# Patient Record
Sex: Female | Born: 1950
Health system: Southern US, Community
[De-identification: ages and names within clinical notes are randomized; demographics above are authoritative.]

## PROBLEM LIST (undated history)

## (undated) DIAGNOSIS — E079 Disorder of thyroid, unspecified: Secondary | ICD-10-CM

## (undated) DIAGNOSIS — I4891 Unspecified atrial fibrillation: Secondary | ICD-10-CM

## (undated) DIAGNOSIS — R112 Nausea with vomiting, unspecified: Secondary | ICD-10-CM

## (undated) DIAGNOSIS — T8859XA Other complications of anesthesia, initial encounter: Secondary | ICD-10-CM

## (undated) DIAGNOSIS — F419 Anxiety disorder, unspecified: Secondary | ICD-10-CM

## (undated) DIAGNOSIS — E039 Hypothyroidism, unspecified: Secondary | ICD-10-CM

## (undated) DIAGNOSIS — I10 Essential (primary) hypertension: Secondary | ICD-10-CM

## (undated) DIAGNOSIS — Z9889 Other specified postprocedural states: Secondary | ICD-10-CM

## (undated) HISTORY — PX: ABDOMINAL HYSTERECTOMY: SHX81

---

## 2003-07-21 ENCOUNTER — Other Ambulatory Visit: Admission: RE | Admit: 2003-07-21 | Discharge: 2003-07-21 | Payer: Self-pay | Admitting: Family Medicine

## 2004-08-21 ENCOUNTER — Other Ambulatory Visit: Admission: RE | Admit: 2004-08-21 | Discharge: 2004-08-21 | Payer: Self-pay | Admitting: Family Medicine

## 2004-10-03 ENCOUNTER — Ambulatory Visit (HOSPITAL_COMMUNITY): Admission: RE | Admit: 2004-10-03 | Discharge: 2004-10-03 | Payer: Self-pay | Admitting: Gastroenterology

## 2005-08-06 ENCOUNTER — Other Ambulatory Visit: Admission: RE | Admit: 2005-08-06 | Discharge: 2005-08-06 | Payer: Self-pay | Admitting: Family Medicine

## 2005-09-16 ENCOUNTER — Emergency Department (HOSPITAL_COMMUNITY): Admission: EM | Admit: 2005-09-16 | Discharge: 2005-09-16 | Payer: Self-pay | Admitting: Emergency Medicine

## 2005-09-30 ENCOUNTER — Ambulatory Visit (HOSPITAL_BASED_OUTPATIENT_CLINIC_OR_DEPARTMENT_OTHER): Admission: RE | Admit: 2005-09-30 | Discharge: 2005-09-30 | Payer: Self-pay | Admitting: General Surgery

## 2005-09-30 ENCOUNTER — Encounter (INDEPENDENT_AMBULATORY_CARE_PROVIDER_SITE_OTHER): Payer: Self-pay | Admitting: Specialist

## 2006-11-11 ENCOUNTER — Other Ambulatory Visit: Admission: RE | Admit: 2006-11-11 | Discharge: 2006-11-11 | Payer: Self-pay | Admitting: Family Medicine

## 2007-11-18 ENCOUNTER — Other Ambulatory Visit: Admission: RE | Admit: 2007-11-18 | Discharge: 2007-11-18 | Payer: Self-pay | Admitting: Family Medicine

## 2008-11-25 ENCOUNTER — Other Ambulatory Visit: Admission: RE | Admit: 2008-11-25 | Discharge: 2008-11-25 | Payer: Self-pay | Admitting: Family Medicine

## 2010-08-09 ENCOUNTER — Encounter (HOSPITAL_COMMUNITY)
Admission: RE | Admit: 2010-08-09 | Discharge: 2010-08-09 | Disposition: A | Discharge status: Home / Self Care | Payer: Managed Care, Other (non HMO) | Source: Ambulatory Visit | Attending: Obstetrics and Gynecology | Admitting: Obstetrics and Gynecology

## 2010-08-09 DIAGNOSIS — Z01812 Encounter for preprocedural laboratory examination: Secondary | ICD-10-CM | POA: Insufficient documentation

## 2010-08-09 DIAGNOSIS — Z01810 Encounter for preprocedural cardiovascular examination: Secondary | ICD-10-CM | POA: Insufficient documentation

## 2010-08-09 LAB — BASIC METABOLIC PANEL
BUN: 9 mg/dL (ref 6–23)
CO2: 28 mEq/L (ref 19–32)
Calcium: 9 mg/dL (ref 8.4–10.5)
Chloride: 101 mEq/L (ref 96–112)
Creatinine, Ser: 0.53 mg/dL (ref 0.4–1.2)
GFR calc Af Amer: >60 mL/min (ref >60)
GFR calc non Af Amer: >60 mL/min (ref >60)
Glucose, Bld: 114 mg/dL — ABNORMAL HIGH (ref 70–99)
Potassium: 3.5 mEq/L (ref 3.5–5.1)
Sodium: 135 mEq/L (ref 135–145)

## 2010-08-09 LAB — CBC
HCT: 42.7 % (ref 36.0–46.0)
Hemoglobin: 14.1 g/dL (ref 12.0–15.0)
MCH: 29.6 pg (ref 26.0–34.0)
MCHC: 33 g/dL (ref 30.0–36.0)
MCV: 89.5 fL (ref 78.0–100.0)
Platelets: 198 10*3/uL (ref 150–400)
RBC: 4.77 MIL/uL (ref 3.87–5.11)
RDW: 13.8 % (ref 11.5–15.5)
WBC: 5.6 10*3/uL (ref 4.0–10.5)

## 2010-08-09 LAB — SURGICAL PCR SCREEN
MRSA, PCR: NEGATIVE
Staphylococcus aureus: NEGATIVE

## 2010-09-06 ENCOUNTER — Other Ambulatory Visit: Payer: Self-pay | Admitting: Obstetrics and Gynecology

## 2010-09-06 ENCOUNTER — Ambulatory Visit (HOSPITAL_COMMUNITY)
Admission: RE | Admit: 2010-09-06 | Discharge: 2010-09-07 | Disposition: A | Discharge status: Home / Self Care | Payer: Managed Care, Other (non HMO) | Source: Ambulatory Visit | Attending: Obstetrics and Gynecology | Admitting: Obstetrics and Gynecology

## 2010-09-06 DIAGNOSIS — Z01818 Encounter for other preprocedural examination: Secondary | ICD-10-CM | POA: Insufficient documentation

## 2010-09-06 DIAGNOSIS — Z01812 Encounter for preprocedural laboratory examination: Secondary | ICD-10-CM | POA: Insufficient documentation

## 2010-09-06 DIAGNOSIS — N812 Incomplete uterovaginal prolapse: Secondary | ICD-10-CM | POA: Insufficient documentation

## 2010-09-07 LAB — CBC
HCT: 34.2 % — ABNORMAL LOW (ref 36.0–46.0)
Hemoglobin: 11.2 g/dL — ABNORMAL LOW (ref 12.0–15.0)
MCH: 29.2 pg (ref 26.0–34.0)
MCHC: 32.7 g/dL (ref 30.0–36.0)
MCV: 89.1 fL (ref 78.0–100.0)
Platelets: 169 10*3/uL (ref 150–400)
RBC: 3.84 MIL/uL — ABNORMAL LOW (ref 3.87–5.11)
RDW: 13.9 % (ref 11.5–15.5)
WBC: 8.6 10*3/uL (ref 4.0–10.5)

## 2010-09-12 NOTE — Op Note (Signed)
Deanna Hamilton, Deanna Hamilton              ACCOUNT NO.:  [id number]  MEDICAL RECORD NO.:  [id number]           PATIENT TYPE:  LOCATION:                                 FACILITY:  PHYSICIAN:  Patsy Baltimore, MD     DATE OF BIRTH:  [birth year]  DATE OF PROCEDURE: DATE OF DISCHARGE:                              OPERATIVE REPORT   PREOPERATIVE DIAGNOSIS:  Uterine prolapse and cystocele.  POSTOPERATIVE DIAGNOSIS:  Uterine prolapse.  PROCEDURE PERFORMED:  Total vaginal hysterectomy with bilateral salpingo- oophorectomy.  SURGEON:  Patsy Baltimore, MD.  ASSISTANT:  Gerald Leitz, MD.  ANESTHESIA:  Regional spinal with IV sedation.  FINDINGS:  Grade 2 to 3 uterine prolapse.  SPECIMEN:  Specimen sent was uterus, bilateral tubes, and ovaries. Specimen was sent to pathology.  ESTIMATED BLOOD LOSS:  Minimal.  Ms. Deanna Hamilton is a 60 year old, para 2, seen in the office with postmenopausal bleeding.  She was found to have a uterine prolapse with an ulcerated cervical edge that caused her bleeding.  She had an on ultrasound, which measured her uterus at 6.9 x 3.5 x 4 cm with an endometrium measuring 2 mm.  Both ovaries appeared normal. On physical exam, the prolapse was seen approaching the introitus and she appeared to have an accompanying possible cystocele.  Therefore, informed consent was obtained from the patient for surgical repair.  She rejected the alternatives of doing nothing or pessary trial.  She was informed of the risks of pain, bleeding, infection, damage to surrounding structures, particularly her bladder, blood vessels, or bowel.  She presented on the day of surgery.  She has allergies to Keflex, doxycycline, and azithromycin.  Therefore, she was given Cleocin and Cipro for antibiotic prophylaxis.  She had TEDs and SCDs on for DVT prophylaxis.  She reiterated her consent and she was taken to the operating room. Decision was made by the patient and the anesthesia team for her  to have regional anesthesia as opposed to general anesthesia.  She did not want to be put asleep.  The spinal anesthesia was done with ease and her legs were then lifted up to the dorsal lithotomy position.  She was prepped and draped in usual sterile fashion. A timeout was called and we began the procedure. A foley catheter was inserted for continuous bladder drainage. A weighted vaginal speculum was placed posteriorly  in the vagina and a long deaver retractor was used to elevate the bladder anteriorly. Two double-toothed tenaculum were used to grasp the anterior and posterior cervical lips, respectively. Approximately 20cc of dilute vasopressin was injected  circumferentially beneath the mucosa. The margin of the bladder reflection was identified. The vaginal wall above the cervix was then circumcised. The anterior vaginal wall was then gasped and elevated with an Allis clamp. With additional tension placed  on the cervical lip below, I then used a combination of my gauze covered finger and the metzenbaum scissors to reach the vesicouterine fold. The was entered sharply with the Mayo scissors. Entry into the peritoneal cavity was confirmed by visualization  of the bowel fat. The anterior deaver was then repositioned to elevate the bladder  anteriorly within the peritoneal cavity. Posteriorly, vaginal vault was incised with a mayo scissors. Once the [facility] cul-de-sac entry was confirmed, a longer bladed  weighted speculum was placed through the opening. The uterosacral ligaments on both sides were identified and clamped off with the curved Heaney clamps. They were transected and ligated with 0-vicryl sutures. The sutures were clamped off with hemostats.  The cardinal ligaments were similarly clamped, cut and sutured. Further pedicles containing the uterine arteries and broad ligament were taken similarly. The uterine artery was doubly ligated to ensure hemostasis. The utero-ovarian ligament and round    ligaments were clamped off with curved heaneys and suture ligated. Once cut, hemostasis was noted on all the pedicles. The specimen was delivered. The ovaries were then identified on both sides and grasped withthe babcock clamp.  The infundibulopelvic ligaments were clamped and cut, and the ovaries were removed. All the pedicles were inspected.  There was some irrigation done with about 30 mL of lukewarm saline.  We had excellent hemostasis.  A pursestring suture was done with 0 Vicryl around the peritoneum to close the peritoneum.  The angle stitches were then done of the cuff.  The old uterosacral sutures were then tied in the midline.  The vaginal cuff was closed in a running locked fashion.  At this point, inspection of the vagina revealed that there was really no cystocele.  The distance from her urethral meatus to the leading edge of the closed vaginal cuff was about 4-5 cm and really there was no room to perform an anterior repair as there was no definite cystocele.  There was a little bit of the rectocele, but it was mild, approximately grade 1.  At this point, a decision was made not to perform an anterior repair because it was not indicated.  All the instruments were removed from the vagina.  The patient tolerated the procedure well.  A Foley was noted to drain clear yellow urine from the beginning to the end of the procedure.  The sponge, lap, and instrument counts were correct x2.  The patient tolerated the procedure well.          ______________________________ Patsy Baltimore, MD     CO/MEDQ  D:  09/06/2010  T:  09/06/2010  Job:  [id number]  Electronically Signed by Patsy Baltimore MD on 09/12/2010 01:28:43 PM

## 2010-10-12 NOTE — Op Note (Signed)
Deanna Hamilton, Deanna Hamilton              ACCOUNT NO.:  [id number]   MEDICAL RECORD NO.:  [id number]          PATIENT TYPE:  AMB   LOCATION:  [facility]                         FACILITY:  [facility]   PHYSICIAN:  Timothy E. Earlene Plater, M.D. DATE OF BIRTH:  [birth year]   DATE OF PROCEDURE:  09/30/2005  DATE OF DISCHARGE:                                 OPERATIVE REPORT   PREOPERATIVE DIAGNOSIS:  Left inguinal hernia.   POSTOPERATIVE DIAGNOSIS:  Left direct and indirect inguinal hernia.   OPERATION PERFORMED:  Repair of hernia with mesh.   SURGEON:  Timothy E. Earlene Plater, M.D.   ANESTHESIA:  General.   INDICATIONS FOR PROCEDURE:  [name] is a 61, otherwise healthy, had  recent cardiac work-up for irregular rhythm, was negative.  She has been  treated.  She has a symptomatic left inguinal hernia and after careful  discussion, she wishes to proceed with repair.  She was seen and identified,  left groin marked and the permit signed.   DESCRIPTION OF PROCEDURE:  The patient was taken to the operating room,  placed supine, LMA anesthesia provided.  The left groin was prepped and  draped in the usual sterile fashion.  The defect was palpable through the  skin and this area was anesthetized with 0.25% Marcaine with epinephrine.  A  short curvilinear incision was made in the skin crease over the palpable  defect and the subcutaneous tissue dissected and the external oblique  identified and dissected free and opened in line with its fibers through the  external ring.  The remnants of the round ligament were dissected.  A lipoma  with an indirect sac was also seen exiting from the internal ring.  Care was  taken to bluntly dissect the ilioinguinal nerve laterally off the area of  dissection.  The sac was suture ligated at its neck and reduced through the  internal ring.  Likewise a lipoma was excised and ligated with a suture.  The internal ring was obliterated with a pursestring suture of 2-0 Prolene.  A small  piece of mesh was cut and fashioned to fit the entire floor of the  canal and was sewn down with a running 2-0 Prolene.  The nerve was unharmed  and the procedure was now essentially complete.  The external oblique was  closed with running 2-0 Vicryl, the deep subcu was  approximated and the skin was closed with 3-0 Monocryl.  Steri-Strips  applied.  Counts correct.  She tolerated it well, was awakened and taken to  recovery room in good condition.   Written and verbal instructions including Vicodin #36.  She will be followed  in the office.      Timothy E. Earlene Plater, M.D.  Electronically Signed     TED/MEDQ  D:  09/30/2005  T:  10/01/2005  Job:  [id number]   cc:   Otilio Connors. Gerri Spore, M.D.  Fax: [phone]

## 2010-10-12 NOTE — Op Note (Signed)
[name], [name]              ACCOUNT NO.:  [id number]   MEDICAL RECORD NO.:  [id number]          PATIENT TYPE:  AMB   LOCATION:  ENDO                         FACILITY:  [facility]   PHYSICIAN:  John C. Madilyn Fireman, M.D.    DATE OF BIRTH:  [birth year]   DATE OF PROCEDURE:  10/03/2004  DATE OF DISCHARGE:                                 OPERATIVE REPORT   PROCEDURE:  Colonoscopy.   INDICATION FOR PROCEDURE:  Family history of colon cancer in a first degree  relative.   DESCRIPTION OF PROCEDURE:  The patient was placed in the left lateral  decubitus position and placed on pulse monitor with continuous low-flow  oxygen delivered by nasal cannula.  She was sedated with 100 mcg of IV  fentanyl and 10 mg of IV Versed.  The Olympus video colonoscope was inserted  into the rectum and advanced to the cecum, confirmed by transillumination of  McBurney's point and visualization of the ileocecal valve and appendiceal  orifice.  The prep was excellent.  The cecum, ascending, transverse,  descending, sigmoid and rectum all appeared normal with no masses, polyps,  diverticula or other mucosa abnormalities.  Retroflexed view of the anus did  reveal some slightly enlarged internal hemorrhoids.  The scope was then  withdrawn and the patient returned to the recovery room in stable condition.  She tolerated the procedure well and there were no immediate complications.   IMPRESSION:  Internal hemorrhoids, otherwise normal study.   PLAN:  Based on her family history, will repeat colonoscopy in five years.      JCH/MEDQ  D:  10/03/2004  T:  10/03/2004  Job:  [id number]   cc:   Otilio Connors. Gerri Spore, M.D.  [address]  [place]  [place] [address]  Fax: [phone]

## 2017-02-22 ENCOUNTER — Emergency Department (HOSPITAL_COMMUNITY)
Admission: EM | Admit: 2017-02-22 | Discharge: 2017-02-22 | Disposition: A | Discharge status: Home / Self Care | Payer: Medicare Other | Attending: Emergency Medicine | Admitting: Emergency Medicine

## 2017-02-22 ENCOUNTER — Emergency Department (HOSPITAL_COMMUNITY): Payer: Medicare Other

## 2017-02-22 ENCOUNTER — Encounter (HOSPITAL_COMMUNITY): Payer: Self-pay | Admitting: Emergency Medicine

## 2017-02-22 DIAGNOSIS — F419 Anxiety disorder, unspecified: Secondary | ICD-10-CM | POA: Insufficient documentation

## 2017-02-22 DIAGNOSIS — F17200 Nicotine dependence, unspecified, uncomplicated: Secondary | ICD-10-CM | POA: Insufficient documentation

## 2017-02-22 DIAGNOSIS — I482 Chronic atrial fibrillation: Secondary | ICD-10-CM | POA: Insufficient documentation

## 2017-02-22 DIAGNOSIS — I4821 Permanent atrial fibrillation: Secondary | ICD-10-CM

## 2017-02-22 DIAGNOSIS — R002 Palpitations: Secondary | ICD-10-CM

## 2017-02-22 HISTORY — DX: Disorder of thyroid, unspecified: E07.9

## 2017-02-22 HISTORY — DX: Anxiety disorder, unspecified: F41.9

## 2017-02-22 LAB — CBC
HCT: 40.4 % (ref 36.0–46.0)
Hemoglobin: 13.7 g/dL (ref 12.0–15.0)
MCH: 29.8 pg (ref 26.0–34.0)
MCHC: 33.9 g/dL (ref 30.0–36.0)
MCV: 87.8 fL (ref 78.0–100.0)
Platelets: 232 10*3/uL (ref 150–400)
RBC: 4.6 MIL/uL (ref 3.87–5.11)
RDW: 13.9 % (ref 11.5–15.5)
WBC: 4.5 10*3/uL (ref 4.0–10.5)

## 2017-02-22 LAB — BASIC METABOLIC PANEL
Anion gap: 9 (ref 5–15)
BUN: 7 mg/dL (ref 6–20)
CO2: 25 mmol/L (ref 22–32)
Calcium: 9 mg/dL (ref 8.9–10.3)
Chloride: 98 mmol/L — ABNORMAL LOW (ref 101–111)
Creatinine, Ser: 0.61 mg/dL (ref 0.44–1.00)
GFR calc Af Amer: >60 mL/min (ref >60)
GFR calc non Af Amer: >60 mL/min (ref >60)
Glucose, Bld: 105 mg/dL — ABNORMAL HIGH (ref 65–99)
Potassium: 3.7 mmol/L (ref 3.5–5.1)
Sodium: 132 mmol/L — ABNORMAL LOW (ref 135–145)

## 2017-02-22 LAB — TSH: TSH: 4.992 u[IU]/mL — ABNORMAL HIGH (ref 0.350–4.500)

## 2017-02-22 LAB — I-STAT TROPONIN, ED: Troponin i, poc: 0.01 ng/mL (ref 0.00–0.08)

## 2017-02-22 MED ORDER — APIXABAN 2.5 MG PO TABS: 2.5000 mg | ORAL_TABLET | Freq: Two times a day (BID) | ORAL | 0 refills | Status: DC

## 2017-02-22 MED ORDER — IOPAMIDOL (ISOVUE-370) INJECTION 76%
INTRAVENOUS | Status: AC
  Administered 2017-02-22: 100 mL via INTRAVENOUS
  Filled 2017-02-22: qty 100

## 2017-02-22 MED ORDER — METOPROLOL TARTRATE 50 MG PO TABS: 50.0000 mg | ORAL_TABLET | Freq: Two times a day (BID) | ORAL | 0 refills | Status: DC

## 2017-02-22 NOTE — ED Provider Notes (Signed)
Emergency Department Provider Note   I have reviewed the triage vital signs and the nursing notes.   HISTORY  Chief Complaint Palpitations (heart racing); Anxiety; and Shortness of Breath   HPI Deanna Hamilton is a 66 y.o. female with history of atrial fibrillation that has been controlled with metoprolol and ASA who presents to ER with episodic palpitations for last few weeks after recently starting levothyroxine. Last time it happened was last night and could not sleep much afterwards and became anxiety. Did not seem to improve with xanax. Episodes more frequent recently.    Past Medical History:  Diagnosis Date  . Anxiety   . Thyroid disease     There are no active problems to display for this patient.   History reviewed. No pertinent surgical history.  Current Outpatient Rx  . Order #: [id number] Class: Historical Med  . Order #: [id number] Class: Historical Med  . Order #: [id number] Class: Historical Med  . Order #: [id number] Class: Historical Med  . Order #: [id number] Class: Historical Med  . Order #: [id number] Class: Print  . Order #: [id number] Class: Print    Allergies Erythromycin; Prednisone; and Xanax [alprazolam]  No family history on file.  Social History Social History  Substance Use Topics  . Smoking status: Current Every Day Smoker  . Smokeless tobacco: Current User  . Alcohol use No    Review of Systems  All other systems negative except as documented in the HPI. All pertinent positives and negatives as reviewed in the HPI. ____________________________________________   PHYSICAL EXAM:  VITAL SIGNS: ED Triage Vitals  Enc Vitals Group     BP 02/22/17 0952 132/86     Pulse Rate 02/22/17 0952 (!) 106     Resp 02/22/17 0952 17     Temp 02/22/17 0952 98.5 F (36.9 C)     Temp Source 02/22/17 0952 Oral     SpO2 02/22/17 0952 97 %     Weight 02/22/17 0958 118 lb (53.5 kg)     Height 02/22/17 0958  (1.651 m)     Pain Score 02/22/17 0957 0     Constitutional: Alert and oriented. Well appearing and in no acute distress. Eyes: Conjunctivae are normal. PERRL. EOMI. Head: Atraumatic. Nose: No congestion/rhinnorhea. Mouth/Throat: Mucous membranes are moist.  Oropharynx non-erythematous. Neck: No stridor.  No meningeal signs.   Cardiovascular: Normal rate, regular rhythm. Good peripheral circulation. Grossly normal heart sounds.  While speaking to her, had an episode of RVR 2/2 Afib that resolved after approx 30 seconds.  Respiratory: Normal respiratory effort.  No retractions. Lungs CTAB. Gastrointestinal: Soft and nontender. No distention.  Musculoskeletal: No lower extremity tenderness nor edema. No gross deformities of extremities. Neurologic:  Normal speech and language. No gross focal neurologic deficits are appreciated.  Skin:  Skin is warm, dry and intact. No rash noted.   ____________________________________________   LABS (all labs ordered are listed, but only abnormal results are displayed)  Labs Reviewed  BASIC METABOLIC PANEL - Abnormal; Notable for the following:       Result Value   Sodium 132 (*)    Chloride 98 (*)    Glucose, Bld 105 (*)    All other components within normal limits  TSH - Abnormal; Notable for the following:    TSH 4.992 (*)    All other components within normal limits  CBC  T4, FREE  T3  I-STAT TROPONIN, ED   ____________________________________________  EKG   EKG Interpretation  Date/Time:  Saturday February 22 2017 10:00:03 EDT Ventricular Rate:  121 PR Interval:  160 QRS Duration: 78 QT Interval:  316 QTC Calculation: 448 R Axis:   86 Text Interpretation:  Sinus tachycardia with Premature atrial complexes Nonspecific ST abnormality Abnormal ECG No significant change since last tracing Confirmed by Marily Memos [phone]) on 02/22/2017 4:40:40 PM      ____________________________________________  RADIOLOGY  Dg Chest 2 View  Result Date: 02/22/2017 CLINICAL DATA:   66 year old female with a history of irregular heartbeat EXAM: CHEST  2 VIEW COMPARISON:  None. FINDINGS: Cardiomediastinal silhouette within normal limits. Stigmata of emphysema, with increased retrosternal airspace, flattened hemidiaphragms, increased AP diameter, and hyperinflation on the AP view. Coarsened interstitial markings bilaterally with pleuroparenchymal scarring at the apices of the lungs. Nodular opacity at the right lung apex just inferior to the clavicle. Symmetric nipple shadows at the lung bases. No confluent airspace disease. No pneumothorax. No pleural effusion. No displaced fracture. IMPRESSION: Changes of advanced emphysema with likely chronic lung changes. No evidence of lobar pneumonia. Nodular density at the right apex, in a region of pleuroparenchymal scarring. Lung nodule not excluded. Note that low-dose CT lung cancer screening is recommended for patients who are 2-71 years of age with a 30+ pack-year history of smoking, and who are currently smoking or quit <=15 years ago. Referral for pulmonary evaluation recommended, for risk factor evaluation and chest CT. Electronically Signed   By: Gilmer Mor D.O.   On: 02/22/2017 11:04   Ct Angio Chest Pe W And/or Wo Contrast  Result Date: 02/22/2017 CLINICAL DATA:  66 year old female with a history of rapid heart rate EXAM: CT ANGIOGRAPHY CHEST WITH CONTRAST TECHNIQUE: Multidetector CT imaging of the chest was performed using the standard protocol during bolus administration of intravenous contrast. Multiplanar CT image reconstructions and MIPs were obtained to evaluate the vascular anatomy. CONTRAST:  100 cc Isovue 300 COMPARISON:  None. FINDINGS: Cardiovascular: Heart: No cardiomegaly. No pericardial fluid/thickening. Calcifications of the left anterior descending and right coronary artery. Aorta: Unremarkable course caliber and contour of the thoracic aorta. Atherosclerotic changes present. Pulmonary arteries: No central, lobar,  segmental, or proximal subsegmental filling defects. Mediastinum/Nodes: No mediastinal adenopathy. Unremarkable appearance of the thoracic inlet. Lungs/Pleura: Extensive paraseptal and centrilobular emphysema of the bilateral lungs. Pleuroparenchymal scarring/thickening of the apices of the lungs. No focal nodule at the right apex as a correlate to the prior chest x-ray. No confluent airspace disease. No pleural effusion or pneumothorax. Mild debris within the trachea at the carina extending into the right lower lobe bronchus. Upper Abdomen: Unremarkable. Musculoskeletal: No displaced fracture. Degenerative changes of the spine. Review of the MIP images confirms the above findings. IMPRESSION: CT is negative for pulmonary emboli. The correlate to findings on prior chest x-ray are related to right apical scarring/ pleuroparenchymal thickening, with no focal lung nodule identified. There are symmetric chronic changes on the left. Bilateral paraseptal and centrilobular emphysema. Emphysema (ICD10-J43.9). Minimal endotracheal debris/ mucous at the carina extending into the right lower lobe. Two vessel coronary artery disease. Electronically Signed   By: Gilmer Mor D.O.   On: 02/22/2017 14:41    ____________________________________________   PROCEDURES  Procedure(s) performed:   Procedures   ____________________________________________   INITIAL IMPRESSION / ASSESSMENT AND PLAN / ED COURSE  Pertinent labs & imaging results that were available during my care of the patient were reviewed by me and considered in my medical decision making (see chart for details).  I doubt that her symptoms are from anxiety. I think that  the patient is likely having paroxysmal Afib RVR which causes palpitations, causing anxiety and  Thus her symptoms. Discussed with Dr. Donnie Aho and will increase her metoprolol and start eliquis. Patient not wanting to start eliquis until Dr. Anne Fu approves, will send message in Epic  to him. Stable for dc home at this time.   ____________________________________________  FINAL CLINICAL IMPRESSION(S) / ED DIAGNOSES  Final diagnoses:  Permanent atrial fibrillation (HCC)  Palpitations     MEDICATIONS GIVEN DURING THIS VISIT:  Medications  iopamidol (ISOVUE-370) 76 % injection (100 mLs Intravenous Contrast Given 02/22/17 1414)     NEW OUTPATIENT MEDICATIONS STARTED DURING THIS VISIT:  Discharge Medication List as of 02/22/2017  3:52 PM    START taking these medications   Details  apixaban (ELIQUIS) 2.5 MG TABS tablet Take 1 tablet (2.5 mg total) by mouth 2 (two) times daily., Starting Sat 02/22/2017, Print    metoprolol tartrate (LOPRESSOR) 50 MG tablet Take 1 tablet (50 mg total) by mouth 2 (two) times daily., Starting Sat 02/22/2017, Print        Note:  This document was prepared using Dragon voice recognition software and may include unintentional dictation errors.   Marily Memos, MD 02/22/17 [phone]

## 2017-02-22 NOTE — Discharge Instructions (Signed)
Cardiologist recommends you start Eliquis for her atrial fibrillation before you see your cardiologist. They also recommended you to increase your metoprolol from 25 daily to 50 twice daily, however I would transition from 25 daily the 25 twice a day for a few days to make sure that you can handle it and then start 50 twice a day. I know you are hesitant to start the Eliquis, if you don't want to start it you need to at least take 324 aspirin.

## 2017-02-22 NOTE — ED Triage Notes (Signed)
Pt. Stated, I woke up around 230 with my heart racing, and I have anxiety, my hand were cold.  I did calm down.

## 2017-02-24 ENCOUNTER — Encounter: Payer: Self-pay | Admitting: Cardiology

## 2017-02-24 ENCOUNTER — Ambulatory Visit (INDEPENDENT_AMBULATORY_CARE_PROVIDER_SITE_OTHER): Payer: Medicare Other | Admitting: Cardiology

## 2017-02-24 VITALS — BP 126/82 | HR 68 | Ht 65.0 in | Wt 117.2 lb

## 2017-02-24 DIAGNOSIS — E039 Hypothyroidism, unspecified: Secondary | ICD-10-CM

## 2017-02-24 DIAGNOSIS — I2510 Atherosclerotic heart disease of native coronary artery without angina pectoris: Secondary | ICD-10-CM

## 2017-02-24 DIAGNOSIS — J438 Other emphysema: Secondary | ICD-10-CM | POA: Diagnosis not present

## 2017-02-24 DIAGNOSIS — I2584 Coronary atherosclerosis due to calcified coronary lesion: Secondary | ICD-10-CM

## 2017-02-24 DIAGNOSIS — Z720 Tobacco use: Secondary | ICD-10-CM

## 2017-02-24 DIAGNOSIS — R002 Palpitations: Secondary | ICD-10-CM

## 2017-02-24 MED ORDER — METOPROLOL TARTRATE 25 MG PO TABS: 25.0000 mg | ORAL_TABLET | Freq: Two times a day (BID) | ORAL | 3 refills | Status: DC

## 2017-02-24 NOTE — Progress Notes (Signed)
Cardiology Office Note:    Date:  02/24/2017   ID:  Deanna Hamilton, DOB [birth year], MRN [id number]  PCP:  Shirlean Mylar, MD  Cardiologist:  Donato Schultz, MD    Referring MD: Shirlean Mylar, MD     History of Present Illness:    Deanna Hamilton is a 66 y.o. female with Palpitations, anxiety, shortness of breath here for evaluation of atrial fibrillation at the request of Dr. Shirlean Mylar  She was in the emergency room on 02/22/17 with episodic palpitations lasting a few weeks after starting Synthroid. She could not sleep and became anxious. Xanax did not seem to help. These episodes seem to become more frequent.  Sinus tachycardia with PACs, nonspecific ST changes were noted in the ER. Lung nodules noted on CT scan with advanced changes of emphysema. CT scan was negative for PE and negative for lung nodule.  The thought was that the patient was having paroxysmal A. fib with RVR causing palpitations. This was discussed also with Dr. Donnie Aho on call who suggested increasing her metoprolol starting Eliquis. She did not want to start it at this time.  Deanna Hamilton has passed. I called after death.  Dr. Sharl Ma to see in 2022/07/18.   She denies any anginal symptoms.  Past Medical History:  Diagnosis Date  . Anxiety   . Thyroid disease     No past surgical history on file.  Current Medications: Current Meds  Medication Sig  . aspirin EC 81 MG tablet Take 81 mg by mouth daily.  . Famotidine (PEPCID PO) Take 1 tablet by mouth as needed (GERD).  Marland Kitchen ibuprofen (ADVIL,MOTRIN) 200 MG tablet Take 200 mg by mouth every 6 (six) hours as needed for mild pain.  Marland Kitchen levothyroxine (SYNTHROID, LEVOTHROID) 25 MCG tablet Take 25 mcg by mouth daily.  . metoprolol succinate (TOPROL-XL) 25 MG 24 hr tablet Take 25 mg by mouth daily.  . metoprolol tartrate (LOPRESSOR) 25 MG tablet Take 25 mg by mouth 2 (two) times daily.  . [DISCONTINUED] metoprolol tartrate (LOPRESSOR) 50 MG tablet Take 1 tablet (50 mg total) by mouth 2  (two) times daily.     Allergies:   Erythromycin; Prednisone; and Xanax [alprazolam]   Social History   Social History  . Marital status: Widowed    Spouse name: N/A  . Number of children: N/A  . Years of education: N/A   Social History Main Topics  . Smoking status: Current Every Day Smoker  . Smokeless tobacco: Current User  . Alcohol use No  . Drug use: No  . Sexual activity: Not Asked   Other Topics Concern  . None   Social History Narrative  . None     Family History: No early family history of CAD   ROS:   Please see the history of present illness.    Positive for palpitations, no syncope, no bruising, no orthopnea, no PND All other systems reviewed and are negative.  EKGs/Labs/Other Studies Reviewed:    The following studies were reviewed today: ER visit reviewed, lab work reviewed, EKG reviewed  EKG:  EKG from ER visit demonstrates sinus tachycardia with PACs. Personally reviewed  Recent Labs: 02/22/2017: BUN 7; Creatinine, Ser 0.61; Hemoglobin 13.7; Platelets 232; Potassium 3.7; Sodium 132; TSH 4.992  Recent Lipid Panel No results found for: CHOL, TRIG, HDL, CHOLHDL, VLDL, LDLCALC, LDLDIRECT  Physical Exam:    VS:  BP 126/82 (BP Location: Right Arm)   Pulse 68   Ht  (1.651 m)  Wt 117 lb 3.2 oz (53.2 kg)   LMP  (LMP Unknown)   BMI 19.50 kg/m     Wt Readings from Last 3 Encounters:  02/24/17 117 lb 3.2 oz (53.2 kg)  02/22/17 118 lb (53.5 kg)     GEN:  Thin in no acute distress HEENT: Normal NECK: No JVD; No carotid bruits LYMPHATICS: No lymphadenopathy CARDIAC: RRROccasional ectopy, no murmurs, rubs, gallops RESPIRATORY:  Clear to auscultation without rales, wheezing or rhonchi  ABDOMEN: Soft, non-tender, non-distended MUSCULOSKELETAL:  No edema; No deformity  SKIN: Warm and dry NEUROLOGIC:  Alert and oriented x 3 PSYCHIATRIC:  Normal affect   ASSESSMENT:    1. Palpitations   2. Acquired hypothyroidism   3. Coronary artery  calcification   4. Tobacco use   5. Other emphysema (HCC)    PLAN:    In order of problems listed above:  Palpations  - This could have been representative of atrial fibrillation with rapid ventricular response however we do not have any objective evidence of this. I will place a 30 day event monitor. Her EKG in the ER shows sinus tachycardia with PACs. Do not start Eliquis unless atrial fibrillation is confirmed. Okay with her taking metoprolol tartrate 25 mg twice a day.  Hypothyroidism  - She has an appointment in January with Dr. Sharl Ma. Continue.  - Low-dose Synthroid 25 g.  Coronary atherosclerosis/calcification  - Noted on CT scan.  Emphysematous changes  - Noted on CT scan. No evidence of apical nodule on CT.  - It may not be a bad idea for her to get spirometry I Dr. Hyman Hopes  Tobacco use  - Tobacco cessation discussed. She is going get Nicorette. She went through a very stressful time in the past 5 years with Deanna Hamilton her husband who passed away and was a patient of mine.   Medication Adjustments/Labs and Tests Ordered: Current medicines are reviewed at length with the patient today.  Concerns regarding medicines are outlined above.  Orders Placed This Encounter  Procedures  . Cardiac event monitor   No orders of the defined types were placed in this encounter.   Signed, Donato Schultz, MD  02/24/2017 11:06 AM     [facility]

## 2017-02-24 NOTE — Patient Instructions (Signed)
Medication Instructions:  The current medical regimen is effective;  continue present plan and medications.  Testing/Procedures: Your physician has recommended that you wear an event monitor. Event monitors are medical devices that record the heart's electrical activity. Doctors most often [place] these monitors to diagnose arrhythmias. Arrhythmias are problems with the speed or rhythm of the heartbeat. The monitor is a small, portable device. You can wear one while you do your normal daily activities. This is usually used to diagnose what is causing palpitations/syncope (passing out).  Follow-Up: Follow up in 6 months with Dr. Anne Fu.  You will receive a letter in the mail 2 months before you are due.  Please call us when you receive this letter to schedule your follow up appointment.  If you need a refill on your cardiac medications before your next appointment, please call your pharmacy.  Thank you for choosing [facility]!!

## 2017-03-10 ENCOUNTER — Ambulatory Visit (INDEPENDENT_AMBULATORY_CARE_PROVIDER_SITE_OTHER): Payer: Medicare Other

## 2017-03-10 DIAGNOSIS — R002 Palpitations: Secondary | ICD-10-CM | POA: Diagnosis not present

## 2017-08-14 ENCOUNTER — Encounter: Payer: Self-pay | Admitting: *Deleted

## 2017-08-25 ENCOUNTER — Encounter: Payer: Self-pay | Admitting: Cardiology

## 2017-08-25 ENCOUNTER — Encounter (INDEPENDENT_AMBULATORY_CARE_PROVIDER_SITE_OTHER): Payer: Self-pay

## 2017-08-25 ENCOUNTER — Ambulatory Visit: Payer: Medicare Other | Admitting: Cardiology

## 2017-08-25 VITALS — BP 110/68 | HR 60 | Ht 65.5 in | Wt 117.1 lb

## 2017-08-25 DIAGNOSIS — I2584 Coronary atherosclerosis due to calcified coronary lesion: Secondary | ICD-10-CM | POA: Diagnosis not present

## 2017-08-25 DIAGNOSIS — R002 Palpitations: Secondary | ICD-10-CM | POA: Diagnosis not present

## 2017-08-25 DIAGNOSIS — I2510 Atherosclerotic heart disease of native coronary artery without angina pectoris: Secondary | ICD-10-CM

## 2017-08-25 DIAGNOSIS — Z720 Tobacco use: Secondary | ICD-10-CM | POA: Diagnosis not present

## 2017-08-25 NOTE — Progress Notes (Signed)
Cardiology Office Note:    Date:  08/25/2017   ID:  Deanna Hamilton, DOB 01/18/1951, MRN [id number]  PCP:  Deanna MylarWebb, Carol, MD  Cardiologist:  Deanna SchultzMark Jerica Creegan, MD    Referring MD: Deanna MylarWebb, Carol, MD     History of Present Illness:    Deanna Hamilton is a [age] y.o. female with palpitations, anxiety, shortness of breath here for follow-up, Dr. Shirlean Mylararol Webb primary physician.  Event monitor did not show any evidence of atrial fibrillation.  She was in the emergency room on 02/22/17 with episodic palpitations lasting a few weeks after starting Synthroid. She could not sleep and became anxious. Xanax did not seem to help. These episodes seem to become more frequent.  Sinus tachycardia with PACs, nonspecific ST changes were noted in the ER. Lung nodules noted on CT scan with advanced changes of emphysema. CT scan was negative for PE and negative for lung nodule.  The thought was that the patient was having paroxysmal A. fib with RVR causing palpitations. This was discussed also with Dr. Donnie Ahoilley on call who suggested increasing her metoprolol starting Eliquis. She did not want to start it at that time.  Deanna CoombsDon has passed. I called after death.  Dr. Sharl Hamilton has seen in endocrinology.   08/25/17- overall doing well.  Rarely feels palpitations.  Feels much better than she did at last visit.  Her thyroid has been adjusted, Dr. Sharl Hamilton.  She does note some redness, scaling on her cheeks bilaterally.  I asked her to discuss this with Dr. Sharl Hamilton.  Could be thyroid side effect.  No bleeding, no syncope, no orthopnea, no PND.    Past Medical History:  Diagnosis Date  . Anxiety   . Thyroid disease     History reviewed. No pertinent surgical history.  Current Medications: Current Meds  Medication Sig  . aspirin EC 81 MG tablet Take 81 mg by mouth daily.  . Famotidine (PEPCID PO) Take 1 tablet by mouth as needed (GERD).  Marland Kitchen. ibuprofen (ADVIL,MOTRIN) 200 MG tablet Take 200 mg by mouth every 6 (six) hours as needed for mild  pain.  Marland Kitchen. levothyroxine (SYNTHROID, LEVOTHROID) 50 MCG tablet Take 1 tablet by mouth daily.  . metoprolol tartrate (LOPRESSOR) 25 MG tablet Take 1 tablet (25 mg total) by mouth 2 (two) times daily.     Allergies:   Erythromycin; Prednisone; and Xanax [alprazolam]   Social History   Socioeconomic History  . Marital status: Widowed    Spouse name: Not on file  . Number of children: Not on file  . Years of education: Not on file  . Highest education level: Not on file  Occupational History  . Not on file  Social Needs  . Financial resource strain: Not on file  . Food insecurity:    Worry: Not on file    Inability: Not on file  . Transportation needs:    Medical: Not on file    Non-medical: Not on file  Tobacco Use  . Smoking status: Current Every Day Smoker    Packs/day: 0.50    Types: Cigarettes  . Smokeless tobacco: Current User  Substance and Sexual Activity  . Alcohol use: No  . Drug use: No  . Sexual activity: Not on file  Lifestyle  . Physical activity:    Days per week: Not on file    Minutes per session: Not on file  . Stress: Not on file  Relationships  . Social connections:    Talks on phone: Not  on file    Gets together: Not on file    Attends religious service: Not on file    Active member of club or organization: Not on file    Attends meetings of clubs or organizations: Not on file    Relationship status: Not on file  Other Topics Concern  . Not on file  Social History Narrative  . Not on file     Family History: No early family history of CAD   ROS:   Please see the history of present illness.   All other review of systems negative  EKGs/Labs/Other Studies Reviewed:    The following studies were reviewed today: ER visit reviewed, lab work reviewed, EKG reviewed  EKG:  EKG from ER visit demonstrates sinus tachycardia with PACs. Personally reviewed  Recent Labs: 02/22/2017: BUN 7; Creatinine, Ser 0.61; Hemoglobin 13.7; Platelets 232;  Potassium 3.7; Sodium 132; TSH 4.992  Recent Lipid Panel No results found for: CHOL, TRIG, HDL, CHOLHDL, VLDL, LDLCALC, LDLDIRECT  Physical Exam:    VS:  BP 110/68   Pulse (!) 45   Ht 5' 5.5" (1.664 m)   Wt 117 lb 1.9 oz (53.1 kg)   LMP  (LMP Unknown)   SpO2 90%   BMI 19.19 kg/m     Wt Readings from Last 3 Encounters:  08/25/17 117 lb 1.9 oz (53.1 kg)  02/24/17 117 lb 3.2 oz (53.2 kg)  02/22/17 118 lb (53.5 kg)    GEN: Thin, in no acute distress  HEENT: normal  Neck: no JVD, carotid bruits, or masses Cardiac: RRR, rare ectopy; no murmurs, rubs, or gallops,no edema  Respiratory:  clear to auscultation bilaterally, normal work of breathing GI: soft, nontender, nondistended, + BS MS: no deformity or atrophy  Skin: warm and dry, mild redness, scaling bilateral cheeks Neuro:  Alert and Oriented x 3, Strength and sensation are intact Psych: euthymic mood, full affect   ASSESSMENT:    No diagnosis found. PLAN:    In order of problems listed above:  Palpations  - This could have been representative of atrial fibrillation with rapid ventricular response however we do not have any objective evidence of this. Event monitor 03/10/17:  Overall reassuring monitor. Rare PVCs noted. Rare PACs consistent with palpitations. No atrial fibrillation. Okay with her taking metoprolol tartrate 25 mg twice a day.  Hypothyroidism  - She has an appointment in January with Dr. Sharl Ma. Continue.  - Low-dose Synthroid 50 g.  Coronary atherosclerosis/calcification  - Noted on CT scan.  Secondary prevention.  Diet, exercise, low-dose aspirin.  LDL cholesterol 94.  May need to start statin.  Emphysematous changes  - Noted on CT scan. No evidence of apical nodule on CT.  Tobacco use  -Continue to encourage cessation. 0.5 PPD. Coronary calcium.    Medication Adjustments/Labs and Tests Ordered: Current medicines are reviewed at length with the patient today.  Concerns regarding medicines are  outlined above.  No orders of the defined types were placed in this encounter.  No orders of the defined types were placed in this encounter.   Signed, Deanna Schultz, MD  08/25/2017 9:58 AM    [facility]

## 2017-08-25 NOTE — Patient Instructions (Signed)

## 2017-09-29 ENCOUNTER — Other Ambulatory Visit: Payer: Self-pay

## 2017-09-29 ENCOUNTER — Ambulatory Visit (HOSPITAL_COMMUNITY): Payer: Medicare Other | Attending: Cardiovascular Disease

## 2017-09-29 ENCOUNTER — Encounter (INDEPENDENT_AMBULATORY_CARE_PROVIDER_SITE_OTHER): Payer: Self-pay

## 2017-09-29 DIAGNOSIS — R002 Palpitations: Secondary | ICD-10-CM | POA: Diagnosis present

## 2017-09-29 DIAGNOSIS — Q211 Atrial septal defect: Secondary | ICD-10-CM | POA: Insufficient documentation

## 2017-09-29 DIAGNOSIS — Z720 Tobacco use: Secondary | ICD-10-CM | POA: Insufficient documentation

## 2018-02-09 ENCOUNTER — Other Ambulatory Visit: Payer: Self-pay | Admitting: Cardiology

## 2019-11-05 ENCOUNTER — Encounter: Payer: Self-pay | Admitting: Cardiology

## 2020-06-07 ENCOUNTER — Observation Stay (HOSPITAL_COMMUNITY)
Admission: EM | Admit: 2020-06-07 | Discharge: 2020-06-07 | Disposition: A | Discharge status: Home / Self Care | Payer: Medicare Other | Attending: Internal Medicine | Admitting: Internal Medicine

## 2020-06-07 ENCOUNTER — Emergency Department (HOSPITAL_COMMUNITY): Payer: Medicare Other

## 2020-06-07 ENCOUNTER — Encounter (HOSPITAL_COMMUNITY): Payer: Self-pay | Admitting: Internal Medicine

## 2020-06-07 ENCOUNTER — Other Ambulatory Visit: Payer: Self-pay

## 2020-06-07 ENCOUNTER — Observation Stay (HOSPITAL_BASED_OUTPATIENT_CLINIC_OR_DEPARTMENT_OTHER): Payer: Medicare Other

## 2020-06-07 ENCOUNTER — Observation Stay (HOSPITAL_COMMUNITY): Payer: Medicare Other

## 2020-06-07 DIAGNOSIS — Z79899 Other long term (current) drug therapy: Secondary | ICD-10-CM | POA: Insufficient documentation

## 2020-06-07 DIAGNOSIS — I4891 Unspecified atrial fibrillation: Secondary | ICD-10-CM | POA: Diagnosis not present

## 2020-06-07 DIAGNOSIS — I351 Nonrheumatic aortic (valve) insufficiency: Secondary | ICD-10-CM

## 2020-06-07 DIAGNOSIS — Z7982 Long term (current) use of aspirin: Secondary | ICD-10-CM | POA: Insufficient documentation

## 2020-06-07 DIAGNOSIS — I2584 Coronary atherosclerosis due to calcified coronary lesion: Secondary | ICD-10-CM | POA: Diagnosis not present

## 2020-06-07 DIAGNOSIS — Z20822 Contact with and (suspected) exposure to covid-19: Secondary | ICD-10-CM | POA: Diagnosis present

## 2020-06-07 DIAGNOSIS — Z720 Tobacco use: Secondary | ICD-10-CM | POA: Diagnosis not present

## 2020-06-07 DIAGNOSIS — R42 Dizziness and giddiness: Secondary | ICD-10-CM | POA: Diagnosis not present

## 2020-06-07 DIAGNOSIS — J439 Emphysema, unspecified: Secondary | ICD-10-CM | POA: Diagnosis not present

## 2020-06-07 DIAGNOSIS — W19XXXA Unspecified fall, initial encounter: Secondary | ICD-10-CM | POA: Diagnosis not present

## 2020-06-07 DIAGNOSIS — E039 Hypothyroidism, unspecified: Secondary | ICD-10-CM | POA: Diagnosis not present

## 2020-06-07 DIAGNOSIS — I4819 Other persistent atrial fibrillation: Secondary | ICD-10-CM | POA: Diagnosis present

## 2020-06-07 DIAGNOSIS — Y92009 Unspecified place in unspecified non-institutional (private) residence as the place of occurrence of the external cause: Secondary | ICD-10-CM | POA: Diagnosis not present

## 2020-06-07 DIAGNOSIS — R531 Weakness: Secondary | ICD-10-CM | POA: Diagnosis not present

## 2020-06-07 DIAGNOSIS — G459 Transient cerebral ischemic attack, unspecified: Secondary | ICD-10-CM | POA: Diagnosis not present

## 2020-06-07 DIAGNOSIS — I10 Essential (primary) hypertension: Secondary | ICD-10-CM | POA: Insufficient documentation

## 2020-06-07 DIAGNOSIS — I361 Nonrheumatic tricuspid (valve) insufficiency: Secondary | ICD-10-CM

## 2020-06-07 DIAGNOSIS — I2510 Atherosclerotic heart disease of native coronary artery without angina pectoris: Secondary | ICD-10-CM | POA: Diagnosis present

## 2020-06-07 DIAGNOSIS — I340 Nonrheumatic mitral (valve) insufficiency: Secondary | ICD-10-CM | POA: Diagnosis not present

## 2020-06-07 DIAGNOSIS — F17210 Nicotine dependence, cigarettes, uncomplicated: Secondary | ICD-10-CM | POA: Insufficient documentation

## 2020-06-07 HISTORY — DX: Essential (primary) hypertension: I10

## 2020-06-07 LAB — BASIC METABOLIC PANEL
Anion gap: 12 (ref 5–15)
BUN: 12 mg/dL (ref 8–23)
CO2: 25 mmol/L (ref 22–32)
Calcium: 9.1 mg/dL (ref 8.9–10.3)
Chloride: 100 mmol/L (ref 98–111)
Creatinine, Ser: 0.83 mg/dL (ref 0.44–1.00)
GFR, Estimated: >60 mL/min (ref >60)
Glucose, Bld: 111 mg/dL — ABNORMAL HIGH (ref 70–99)
Potassium: 4.3 mmol/L (ref 3.5–5.1)
Sodium: 137 mmol/L (ref 135–145)

## 2020-06-07 LAB — LIPID PANEL
Cholesterol: 150 mg/dL (ref 0–200)
HDL: 53 mg/dL (ref >40)
LDL Cholesterol: 89 mg/dL (ref 0–99)
Total CHOL/HDL Ratio: 2.8 RATIO
Triglycerides: 39 mg/dL (ref <150)
VLDL: 8 mg/dL (ref 0–40)

## 2020-06-07 LAB — ECHOCARDIOGRAM COMPLETE
Area-P 1/2: 4.96 cm2
MV M vel: 4.74 m/s
MV Peak grad: 89.9 mmHg
P 1/2 time: 813 msec
Radius: 0.3 cm
S' Lateral: 4.03 cm

## 2020-06-07 LAB — CBC
HCT: 46 % (ref 36.0–46.0)
Hemoglobin: 15.4 g/dL — ABNORMAL HIGH (ref 12.0–15.0)
MCH: 31 pg (ref 26.0–34.0)
MCHC: 33.5 g/dL (ref 30.0–36.0)
MCV: 92.6 fL (ref 80.0–100.0)
Platelets: 205 10*3/uL (ref 150–400)
RBC: 4.97 MIL/uL (ref 3.87–5.11)
RDW: 14.2 % (ref 11.5–15.5)
WBC: 6.1 10*3/uL (ref 4.0–10.5)
nRBC: 0 % (ref 0.0–0.2)

## 2020-06-07 LAB — HEMOGLOBIN A1C
Hgb A1c MFr Bld: 5.6 % (ref 4.8–5.6)
Mean Plasma Glucose: 114.02 mg/dL

## 2020-06-07 LAB — RESP PANEL BY RT-PCR (FLU A&B, COVID) ARPGX2
Influenza A by PCR: NEGATIVE
Influenza B by PCR: NEGATIVE
SARS Coronavirus 2 by RT PCR: NEGATIVE

## 2020-06-07 LAB — CBG MONITORING, ED: Glucose-Capillary: 125 mg/dL — ABNORMAL HIGH (ref 70–99)

## 2020-06-07 LAB — TSH: TSH: 2.081 u[IU]/mL (ref 0.350–4.500)

## 2020-06-07 LAB — TROPONIN I (HIGH SENSITIVITY)
Troponin I (High Sensitivity): 9 ng/L (ref <18)
Troponin I (High Sensitivity): 10 ng/L (ref <18)

## 2020-06-07 LAB — BRAIN NATRIURETIC PEPTIDE: B Natriuretic Peptide: 170 pg/mL — ABNORMAL HIGH (ref 0.0–100.0)

## 2020-06-07 MED ORDER — APIXABAN 5 MG PO TABS
5.0000 mg | ORAL_TABLET | Freq: Two times a day (BID) | ORAL | Status: DC
  Administered 2020-06-07: 5 mg via ORAL
  Filled 2020-06-07: qty 1

## 2020-06-07 MED ORDER — LORAZEPAM 2 MG/ML IJ SOLN: 0.5000 mg | Freq: Once | INTRAMUSCULAR | Status: DC

## 2020-06-07 MED ORDER — APIXABAN 5 MG PO TABS: 5.0000 mg | ORAL_TABLET | Freq: Two times a day (BID) | ORAL | 0 refills | Status: DC

## 2020-06-07 MED ORDER — ACETAMINOPHEN 160 MG/5ML PO SOLN: 650.0000 mg | ORAL | Status: DC | PRN

## 2020-06-07 MED ORDER — DILTIAZEM HCL ER COATED BEADS 120 MG PO CP24
120.0000 mg | ORAL_CAPSULE | Freq: Every day | ORAL | Status: DC
  Administered 2020-06-07: 120 mg via ORAL
  Filled 2020-06-07: qty 1

## 2020-06-07 MED ORDER — SODIUM CHLORIDE 0.9 % IV BOLUS
1000.0000 mL | Freq: Once | INTRAVENOUS | Status: AC
  Administered 2020-06-07: 1000 mL via INTRAVENOUS

## 2020-06-07 MED ORDER — DILTIAZEM HCL-DEXTROSE 125-5 MG/125ML-% IV SOLN (PREMIX)
5.0000 mg/h | INTRAVENOUS | Status: DC
  Administered 2020-06-07: 5 mg/h via INTRAVENOUS
  Filled 2020-06-07: qty 125

## 2020-06-07 MED ORDER — ACETAMINOPHEN 325 MG PO TABS: 650.0000 mg | ORAL_TABLET | ORAL | Status: DC | PRN

## 2020-06-07 MED ORDER — STROKE: EARLY STAGES OF RECOVERY BOOK: Freq: Once | Status: DC

## 2020-06-07 MED ORDER — DILTIAZEM HCL ER COATED BEADS 120 MG PO CP24: 120.0000 mg | ORAL_CAPSULE | Freq: Every day | ORAL | 0 refills | Status: DC

## 2020-06-07 MED ORDER — ACETAMINOPHEN 650 MG RE SUPP: 650.0000 mg | RECTAL | Status: DC | PRN

## 2020-06-07 MED ORDER — DILTIAZEM LOAD VIA INFUSION
15.0000 mg | Freq: Once | INTRAVENOUS | Status: AC
  Administered 2020-06-07: 15 mg via INTRAVENOUS
  Filled 2020-06-07: qty 15

## 2020-06-07 NOTE — ED Notes (Signed)
Pt in ECHO

## 2020-06-07 NOTE — ED Notes (Signed)
Pt in CT.

## 2020-06-07 NOTE — ED Provider Notes (Signed)
[facility] EMERGENCY DEPARTMENT Provider Note   CSN: [id number] Arrival date & time: 06/07/20  5885     History Chief Complaint  Patient presents with  . Dizziness    Deanna Hamilton is a 70 y.o. female.  The history is provided by the patient, medical records and a relative. No language interpreter was used.  Dizziness    70 year old female with significant history of tobacco use, anxiety, thyroid disease presents to ED with complaints of dizziness.  History obtained through patient and through daughter who is at bedside.  3 days ago patient was taking care of her grandchild who recently tested positive for COVID infection.  This morning she woke up, reported feeling lightheadedness and dizzy and weak that last for short amount of time but patient states that has since resolved.  She says she stood up and she nearly passed out.  She did report some mild headache that has since resolved.  No report of chest pain, trouble breathing, nausea, diaphoresis.  Denies any runny nose sneezing or coughing or sore throat.  No loss of taste or smell.  No abdominal pain or back pain.  Denies any focal numbness or focal weakness.  Daughter reports patient appears to be more confused than her baseline.  Daughter also reports this morning patient did complain of transient numbness and weakness on the right side of her arm and legs that lasted for few minutes and resolved.  Patient denies any recent change in any medication.  She has been vaccinated for COVID-19 without booster.  She has been eating and drinking fine.  Past Medical History:  Diagnosis Date  . Anxiety   . Thyroid disease     Patient Active Problem List   Diagnosis Date Noted  . Palpitations 02/24/2017  . Acquired hypothyroidism 02/24/2017  . Coronary artery calcification 02/24/2017  . Tobacco use 02/24/2017  . Other emphysema (HCC) 02/24/2017    No past surgical history on file.   OB History   No obstetric  history on file.     No family history on file.  Social History   Tobacco Use  . Smoking status: Current Every Day Smoker    Packs/day: 0.50    Types: Cigarettes  . Smokeless tobacco: Current User  Vaping Use  . Vaping Use: Never used  Substance Use Topics  . Alcohol use: No  . Drug use: No    Home Medications Prior to Admission medications   Medication Sig Start Date End Date Taking? Authorizing Provider  aspirin EC 81 MG tablet Take 81 mg by mouth daily.    [provider]  Famotidine (PEPCID PO) Take 1 tablet by mouth as needed (GERD).    [provider]  ibuprofen (ADVIL,MOTRIN) 200 MG tablet Take 200 mg by mouth every 6 (six) hours as needed for mild pain.    [provider]  levothyroxine (SYNTHROID, LEVOTHROID) 50 MCG tablet Take 1 tablet by mouth daily. 07/11/17   [provider]  metoprolol tartrate (LOPRESSOR) 25 MG tablet TAKE ONE TABLET BY MOUTH TWICE A DAY 02/10/18   Jake Bathe, MD    Allergies    Erythromycin, Prednisone, and Xanax [alprazolam]  Review of Systems   Review of Systems  Neurological: Positive for dizziness.  All other systems reviewed and are negative.   Physical Exam Updated Vital Signs BP 137/85 (BP Location: Right Arm)   Pulse (!) 125   Temp 98.6 F (37 C) (Oral)   Resp 16  LMP  (LMP Unknown)   SpO2 99%   Physical Exam Vitals and nursing note reviewed.  Constitutional:      General: She is not in acute distress.    Appearance: She is well-developed and well-nourished.  HENT:     Head: Atraumatic.     Mouth/Throat:     Mouth: Mucous membranes are moist.  Eyes:     General: No visual field deficit.    Conjunctiva/sclera: Conjunctivae normal.  Neck:     Vascular: No carotid bruit.     Comments: No thyromegaly Cardiovascular:     Rate and Rhythm: Tachycardia present. Rhythm irregular.     Pulses: Normal pulses.     Heart sounds: Normal heart sounds.  Pulmonary:     Effort: Pulmonary  effort is normal.     Breath sounds: Normal breath sounds.  Abdominal:     General: Abdomen is flat.     Palpations: Abdomen is soft.  Musculoskeletal:     Cervical back: Neck supple. No rigidity or tenderness.  Skin:    Findings: No rash.  Neurological:     Mental Status: She is alert and oriented to person, place, and time.     GCS: GCS eye subscore is 4. GCS verbal subscore is 5. GCS motor subscore is 6.     Cranial Nerves: Cranial nerves are intact. No cranial nerve deficit, dysarthria or facial asymmetry.     Sensory: Sensation is intact.     Motor: Motor function is intact.     Coordination: Coordination is intact.  Psychiatric:        Mood and Affect: Mood and affect and mood normal.     ED Results / Procedures / Treatments   Labs (all labs ordered are listed, but only abnormal results are displayed) Labs Reviewed  BASIC METABOLIC PANEL - Abnormal; Notable for the following components:      Result Value   Glucose, Bld 111 (*)    All other components within normal limits  CBC - Abnormal; Notable for the following components:   Hemoglobin 15.4 (*)    All other components within normal limits  BRAIN NATRIURETIC PEPTIDE - Abnormal; Notable for the following components:   B Natriuretic Peptide 170.0 (*)    All other components within normal limits  CBG MONITORING, ED - Abnormal; Notable for the following components:   Glucose-Capillary 125 (*)    All other components within normal limits  RESP PANEL BY RT-PCR (FLU A&B, COVID) ARPGX2  TSH  URINALYSIS, ROUTINE W REFLEX MICROSCOPIC  TROPONIN I (HIGH SENSITIVITY)  TROPONIN I (HIGH SENSITIVITY)    EKG EKG Interpretation  Date/Time:  Wednesday June 07 2020 08:45:46 EST Ventricular Rate:  161 PR Interval:    QRS Duration: 72 QT Interval:  240 QTC Calculation: 392 R Axis:   86 Text Interpretation: Atrial fibrillation with rapid ventricular response ST & T wave abnormality, consider inferolateral ischemia Abnormal  ECG Since last tracing Rate faster afib has replaced sinus tachycardia Confirmed by Susy Frizzle [phone]) on 06/07/2020 10:08:14 AM   Radiology DG Chest Portable 1 View  Result Date: 06/07/2020 CLINICAL DATA:  Dizziness. EXAM: PORTABLE CHEST 1 VIEW COMPARISON:  February 22, 2017. FINDINGS: Stable cardiomediastinal silhouette. No pneumothorax or pleural effusion is noted. Stable biapical scarring is noted. No acute pulmonary disease is noted. Bony thorax is unremarkable. IMPRESSION: No active disease. Electronically Signed   By: Lupita Raider M.D.   On: 06/07/2020 11:37    Procedures .Critical Care Performed  by: Fayrene Helper, PA-C Authorized by: Fayrene Helper, PA-C   Critical care provider statement:    Critical care time (minutes):  40   Critical care was time spent personally by me on the following activities:  Discussions with consultants, evaluation of patient's response to treatment, examination of patient, ordering and performing treatments and interventions, ordering and review of laboratory studies, ordering and review of radiographic studies, pulse oximetry, re-evaluation of patient's condition, obtaining history from patient or surrogate and review of old charts   (including critical care time)  Medications Ordered in ED Medications  diltiazem (CARDIZEM) 1 mg/mL load via infusion 15 mg (15 mg Intravenous Bolus from Bag 06/07/20 1240)    And  diltiazem (CARDIZEM) 125 mg in dextrose 5% 125 mL (1 mg/mL) infusion (5 mg/hr Intravenous New Bag/Given 06/07/20 1240)  apixaban (ELIQUIS) tablet 5 mg (5 mg Oral Given 06/07/20 1242)  sodium chloride 0.9 % bolus 1,000 mL (1,000 mLs Intravenous New Bag/Given 06/07/20 1128)    ED Course  I have reviewed the triage vital signs and the nursing notes.  Pertinent labs & imaging results that were available during my care of the patient were reviewed by me and considered in my medical decision making (see chart for details).    MDM  Rules/Calculators/A&P                          BP 116/85   Pulse 81   Temp 98.6 F (37 C) (Oral)   Resp 14   LMP  (LMP Unknown)   SpO2 99%   Final Clinical Impression(s) / ED Diagnoses Final diagnoses:  Atrial fibrillation with RVR (HCC)  TIA (transient ischemic attack)    Rx / DC Orders ED Discharge Orders    None     11:23 AM Patient here with complaints of near syncope earlier today.  Dizziness described more as lightheadedness instead of room spinning sensation.  She was recently taking care of her family member who was sick with COVID however she denies any COVID symptoms and she has been vaccinated.  Patient was found to be tachycardic with EKG shows evidence of atrial fibrillation with RVR.  Current heart rate is in the 140s-160s however blood pressure is stable.  Suspect onset this a.m., however will consult cardiology for recommendation in regards to cardioversion.  12:03 PM Appreciate consultation from on call cardiologist DR. Eldridge Dace who recommend starting diltiazem bolus and drips as it may help convert her afib w/RVR.  However, if no improvement, then consider cardioversion. Will start Eliquis as well.  Patient CHADS2 Vasc score is 2 which puts her in the moderate to high risk for stroke  12:59 PM Shortly after administration of diltiazem, heart rate normalized.  Did have what appears to be a TIA this morning when she reported having weakness to her right arm and right leg.  She would likely benefit from hospital admission for TIA work-up.  At this time patient does not want to stay in the hospital.  I discussed the risk and benefit  1:24 PM After reassessments and further discussion, patient is amenable to be admitted for TIA work-up.  Appreciate consultation from [facility] hospitalist, Dr. Katrinka Blazing who agrees to see and admit patient for further care.   Fayrene Helper, PA-C 06/07/20 1326    Pollyann Savoy, MD 06/07/20 1329

## 2020-06-07 NOTE — ED Notes (Signed)
Pt unable to use Purewick, bedpan, and bedside commode. Pt wheeled to the bathroom, per [place] - PA.

## 2020-06-07 NOTE — Discharge Instructions (Signed)
Please call and follow-up closely with cardiologist for further management of your condition. Take blood thinning medication as well as medication to control your heart rate and monitor your heart rate and blood pressure regularly. Return to the ED if you have any concerns.

## 2020-06-07 NOTE — ED Triage Notes (Signed)
Pt exposed to covid on Sunday. Woke up this morning and had an episode of dizziness. Dizziness has resolved and patient currently has no complaints. Tachy in triage, but reports being nervous.

## 2020-06-07 NOTE — Consult Note (Addendum)
[facility] Medical Consultation  Deanna Hamilton ZOX:[id number] DOB: [birth year] DOA: 06/07/2020 PCP: Shirlean Mylar, MD   Requesting physician: Fayrene Helper, PA-C Date of consultation: 06/07/2020 Reason for consultation: Admission  Impression/Recommendations    1. Atrial fibrillation with RVR: Patient presents in A. fib with RVR with heart rates into the 160s.  Review of records no previous concern for atrial fibrillation and palpitations for which she had been being followed by Dr. Anne Fu of cardiology.  Patient has been started initially on a diltiazem drip with heart rates currently controlled less than 100.   -Echocardiogram has already been performed -Case was discussed with Dr. Eldridge Dace  of cardiology who will arrange outpatient follow-up.  Recommended starting patient on Cardizem CD 120 mg daily and stop metoprolol.  Patient should check blood pressures at least twice daily and notify cardiologist if systolic blood pressure drops less than 90. -Continue Eliquis 5 mg twice daily  2.  Weakness and fall: Patient reported having acute weakness and fall, but denies any loss of consciousness or trauma to the head.  Unclear if weakness was one-sided at this time she cannot remember.  Question the possibility of a TIA. -Check CT scan of the brain  3. Essential hypertension: Currently stable.  Home blood pressure medications include metoprolol 25 mg twice daily.  Patient last took a dose last night. -Stop metoprolol and start Cardizem as seen above  4.  Coronary artery calcification: Seen on previous CT imaging of the chest.  5. Hypothyroidism: TSH 2.081 on admission. -Continue current dose of levothyroxine  6.  Tobacco abuse: Chronic. - Will need to continue to counsel patient on the cessation of tobacco use  7.  COVID-19 exposure: Acute.  Patient reports grandson is COVID-19 positive, she had received both COVID vaccines, but had not received her booster.  Initial COVID-19 screening  was negative  I will followup again tomorrow. Please contact me if I can be of assistance in the meanwhile. Thank you for this consultation.  Chief Complaint: Dizziness  HPI:  Deanna Hamilton is a 70 y.o. female with medical history significant of of hypertension, CAD, hypothyroidism, tobacco abuse and anxiety presents with complaints of dizziness upon waking up this morning at around 8 AM.  He has been taking care of her grandchild who is 81 years old and recently tested positive for COVID-19.  She has had the initial 2 COVID vaccines, but has not received a booster.  She complained of dizziness this morning upon awakening and felt her heart racing.  She stood up and reports that she felt weak and fell to the floor.  She did not recall if she felt weak on one side of the body versus the other.  Denies any loss of consciousness or trauma to her head.  Patient admits to having symptoms of headache over the last 3 days.  Denies any significant cough, shortness of breath, fever, nausea, vomiting, diarrhea, or change in taste/smell.  ED Course: On admission into the emergency department patient was seen to be afebrile with pulse into the 160s and A. fib with RVR, respirations 14-23, all other vital signs maintained.  Labs significant for hemoglobin 15.4, BNP 170, troponin 9, and TSH 2.081.  Patient has been given 1 L normal saline IV fluids, started on diltiazem drip, and Eliquis.  COVID-19 screening was negative. [facility] was called to admit, but patient requesting discharge.   Review of Systems  Constitutional: Negative for fever.  Eyes: Negative for photophobia and pain.  Cardiovascular:  Positive for palpitations. Negative for chest pain.  Gastrointestinal: Negative for abdominal pain, nausea and vomiting.  Genitourinary: Negative for dysuria and hematuria.  Musculoskeletal: Negative for joint pain.  Skin: Negative for itching.  Neurological: Positive for dizziness and headaches. Negative for loss of  consciousness.  Psychiatric/Behavioral: Positive for substance abuse.     Past Medical History:  Diagnosis Date  . Anxiety   . Thyroid disease    History reviewed. No pertinent surgical history.   Social History:  reports that she has been smoking cigarettes. She has been smoking about 0.50 packs per day. She uses smokeless tobacco. She reports that she does not drink alcohol and does not use drugs.  Allergies  Allergen Reactions  . Erythromycin Nausea Only  . Prednisone Other (See Comments)    Skin Crawling  . Xanax [Alprazolam] Other (See Comments)    Skin Crawling   No family history on file.  Prior to Admission medications   Medication Sig Start Date End Date Taking? Authorizing Jacquise Rarick  aspirin EC 81 MG tablet Take 81 mg by mouth daily.   Yes Terron Merfeld, Historical, MD  Famotidine (PEPCID PO) Take 1 tablet by mouth as needed (GERD).   Yes Deslyn Cavenaugh, Historical, MD  ibuprofen (ADVIL,MOTRIN) 200 MG tablet Take 200 mg by mouth every 6 (six) hours as needed for mild pain.   Yes Aamani Moose, Historical, MD  levothyroxine (SYNTHROID) 88 MCG tablet Take 88 mcg by mouth daily. 07/11/17  Yes Mega Kinkade, Historical, MD  metoprolol tartrate (LOPRESSOR) 25 MG tablet TAKE ONE TABLET BY MOUTH TWICE A DAY Patient taking differently: Take 25 mg by mouth 2 (two) times daily. 02/10/18  Yes Jake Bathe, MD   Physical Exam:  Constitutional: Elderly female currently no acute distress Vitals:   06/07/20 0839 06/07/20 1115 06/07/20 1230 06/07/20 1300  BP: 137/85 (!) 148/83 97/86 116/85  Pulse: (!) 125 100 86 81  Resp: 16 (!) 23 18 14   Temp: 98.6 F (37 C)     TempSrc: Oral     SpO2: 99% 97% 98% 99%   Eyes: PERRL, lids and conjunctivae normal ENMT: Mucous membranes are moist. Posterior pharynx clear of any exudate or lesions.   Neck: normal, supple, no masses, no thyromegaly Respiratory: clear to auscultation bilaterally, no wheezing, no crackles. Normal respiratory effort. No accessory muscle  use.  Cardiovascular: Irregular irregular, no murmurs / rubs / gallops. No extremity edema. 2+ pedal pulses. No carotid bruits.  Abdomen: no tenderness, no masses palpated. No hepatosplenomegaly. Bowel sounds positive.  Musculoskeletal: no clubbing / cyanosis. No joint deformity upper and lower extremities. Good ROM, no contractures. Normal muscle tone.  Skin: no rashes, lesions, ulcers. No induration Neurologic: CN 2-12 grossly intact. Sensation intact, DTR normal. Strength 5/5 in all 4.  Psychiatric: Normal judgment and insight. Alert and oriented x 3. Normal mood.   Labs on Admission:  Basic Metabolic Panel: Recent Labs  Lab 06/07/20 0919  NA 137  K 4.3  CL 100  CO2 25  GLUCOSE 111*  BUN 12  CREATININE 0.83  CALCIUM 9.1   Liver Function Tests: No results for input(s): AST, ALT, ALKPHOS, BILITOT, PROT, ALBUMIN in the last 168 hours. No results for input(s): LIPASE, AMYLASE in the last 168 hours. No results for input(s): AMMONIA in the last 168 hours. CBC: Recent Labs  Lab 06/07/20 0919  WBC 6.1  HGB 15.4*  HCT 46.0  MCV 92.6  PLT 205   Cardiac Enzymes: No results for input(s): CKTOTAL, CKMB, CKMBINDEX, TROPONINI  in the last 168 hours. BNP: Invalid input(s): POCBNP CBG: Recent Labs  Lab 06/07/20 0842  GLUCAP 125*    Radiological Exams on Admission: DG Chest Portable 1 View  Result Date: 06/07/2020 CLINICAL DATA:  Dizziness. EXAM: PORTABLE CHEST 1 VIEW COMPARISON:  February 22, 2017. FINDINGS: Stable cardiomediastinal silhouette. No pneumothorax or pleural effusion is noted. Stable biapical scarring is noted. No acute pulmonary disease is noted. Bony thorax is unremarkable. IMPRESSION: No active disease. Electronically Signed   By: Lupita Raider M.D.   On: 06/07/2020 11:37    EKG: Independently reviewed.  Atrial fibrillation with RVR with heart rate 161  Time spent: >45 minutes  Rondell A Smith [facility]   If 7PM-7AM, please contact  night-coverage

## 2020-06-07 NOTE — Progress Notes (Signed)
  Echocardiogram 2D Echocardiogram with 3D has been performed.  Leta Jungling M 06/07/2020, 2:49 PM

## 2020-06-08 ENCOUNTER — Telehealth: Payer: Self-pay | Admitting: Cardiology

## 2020-06-08 NOTE — Telephone Encounter (Signed)
Dr. Anne Fu, this pt had an echo done in the hospital while she was in the ER yesterday.  Dr. Eden Emms was the reader of her echo.  She is calling in today inquiring those echo results.  I don't see where it was sent to your in-basket to review.  The MD at the ER ordered it on the pt yesterday.  She is asking if you could read and result her echo.  She is aware we will call her back once you have further advised on this matter, and/or reviewed and resulted her inpatient echo. Thanks!

## 2020-06-08 NOTE — Telephone Encounter (Signed)
Deanna Hamilton is calling requesting her Echo results. Please advise.

## 2020-06-08 NOTE — Telephone Encounter (Signed)
Reviewed echo.  Normal pump function. Mildly dilated atria. Overall reassuring.   Donato Schultz, MD

## 2020-06-09 NOTE — Telephone Encounter (Signed)
Left the pt a message to call the office back and request to speak with a triage nurse, to endorse echo results per Dr. Anne Fu.

## 2020-06-09 NOTE — Telephone Encounter (Signed)
The patient has been notified of the result and verbalized understanding.  All questions (if any) were answered. Loa Socks, LPN 9/75/8832 5:49 PM    Pt also wanted to cancel her virtual visit with Tereso Newcomer PA-C for 1/25, being she was able to now get an appt with Dr. Anne Fu for 06/28/20.  Virtual cancelled and informed the pt we will see her as planned with Dr. Anne Fu in the office on 06/28/20. Pt verbalized understanding and agrees with this plan.

## 2020-06-09 NOTE — Telephone Encounter (Signed)
Patient calling back.   °

## 2020-06-12 ENCOUNTER — Telehealth: Payer: Self-pay | Admitting: Cardiology

## 2020-06-12 NOTE — Telephone Encounter (Signed)
° ° °  Pt c/o medication issue:  1. Name of Medication: apixaban (ELIQUIS) 5 MG TABS tablet diltiazem (CARDIZEM CD) 120 MG 24 hr capsule  2. How are you currently taking this medication (dosage and times per day)? As directed   3. Are you having a reaction (difficulty breathing--STAT)?   4. What is your medication issue? Patient is not sure if her low HR is associated with the medications or not  STAT if HR is under 50 or over 120 (normal HR is 60-100 beats per minute)  1) What is your heart rate? 87  2) Do you have a log of your heart rate readings (document readings)? 101, 90 3) Do you have any other symptoms? Unsteady feeling , dry mouth

## 2020-06-12 NOTE — Telephone Encounter (Signed)
Spoke with the patient who states that she was started on Eliquis and diltizem at the hospital last week. She states that she has been monitoring her heart rate and one time it was in the 50s and the highest it has been is 103. Currently it is at 6 which she states is normally what it runs. She has not been taking her blood pressure. She states that she does feel palpitations sometiems. She states that she has been feeling unsteady during the day since starting on these medications. She does not feel like she is going to pass out and denies any other symptoms. She states usually by the evening she feels back to normal. She states that she takes the Cardizem around 11:30am. I advised her to try taking it with her evening medications to see if this helps. She will let us know if she continues to have symptoms.

## 2020-06-20 ENCOUNTER — Telehealth: Payer: Medicare Other | Admitting: Physician Assistant

## 2020-06-23 ENCOUNTER — Emergency Department (HOSPITAL_COMMUNITY): Payer: Medicare Other

## 2020-06-23 ENCOUNTER — Emergency Department (HOSPITAL_COMMUNITY)
Admission: EM | Admit: 2020-06-23 | Discharge: 2020-06-23 | Disposition: A | Discharge status: Home / Self Care | Payer: Medicare Other | Attending: Emergency Medicine | Admitting: Emergency Medicine

## 2020-06-23 ENCOUNTER — Encounter (HOSPITAL_COMMUNITY): Payer: Self-pay

## 2020-06-23 ENCOUNTER — Other Ambulatory Visit: Payer: Self-pay

## 2020-06-23 DIAGNOSIS — R1110 Vomiting, unspecified: Secondary | ICD-10-CM | POA: Diagnosis not present

## 2020-06-23 DIAGNOSIS — R002 Palpitations: Secondary | ICD-10-CM | POA: Diagnosis not present

## 2020-06-23 DIAGNOSIS — I4891 Unspecified atrial fibrillation: Secondary | ICD-10-CM | POA: Diagnosis not present

## 2020-06-23 DIAGNOSIS — E039 Hypothyroidism, unspecified: Secondary | ICD-10-CM | POA: Diagnosis not present

## 2020-06-23 DIAGNOSIS — F17210 Nicotine dependence, cigarettes, uncomplicated: Secondary | ICD-10-CM | POA: Insufficient documentation

## 2020-06-23 DIAGNOSIS — Z7901 Long term (current) use of anticoagulants: Secondary | ICD-10-CM | POA: Diagnosis not present

## 2020-06-23 DIAGNOSIS — I517 Cardiomegaly: Secondary | ICD-10-CM | POA: Diagnosis not present

## 2020-06-23 DIAGNOSIS — Z79899 Other long term (current) drug therapy: Secondary | ICD-10-CM | POA: Diagnosis not present

## 2020-06-23 DIAGNOSIS — Z743 Need for continuous supervision: Secondary | ICD-10-CM | POA: Diagnosis not present

## 2020-06-23 DIAGNOSIS — R42 Dizziness and giddiness: Secondary | ICD-10-CM | POA: Diagnosis present

## 2020-06-23 DIAGNOSIS — I10 Essential (primary) hypertension: Secondary | ICD-10-CM | POA: Diagnosis not present

## 2020-06-23 HISTORY — DX: Unspecified atrial fibrillation: I48.91

## 2020-06-23 LAB — COMPREHENSIVE METABOLIC PANEL
ALT: 15 U/L (ref 0–44)
AST: 23 U/L (ref 15–41)
Albumin: 3.6 g/dL (ref 3.5–5.0)
Alkaline Phosphatase: 79 U/L (ref 38–126)
Anion gap: 14 (ref 5–15)
BUN: 10 mg/dL (ref 8–23)
CO2: 22 mmol/L (ref 22–32)
Calcium: 8.8 mg/dL — ABNORMAL LOW (ref 8.9–10.3)
Chloride: 98 mmol/L (ref 98–111)
Creatinine, Ser: 0.78 mg/dL (ref 0.44–1.00)
GFR, Estimated: >60 mL/min (ref >60)
Glucose, Bld: 100 mg/dL — ABNORMAL HIGH (ref 70–99)
Potassium: 3.8 mmol/L (ref 3.5–5.1)
Sodium: 134 mmol/L — ABNORMAL LOW (ref 135–145)
Total Bilirubin: 0.5 mg/dL (ref 0.3–1.2)
Total Protein: 6.6 g/dL (ref 6.5–8.1)

## 2020-06-23 LAB — CBC
HCT: 46.8 % — ABNORMAL HIGH (ref 36.0–46.0)
Hemoglobin: 15.3 g/dL — ABNORMAL HIGH (ref 12.0–15.0)
MCH: 29.8 pg (ref 26.0–34.0)
MCHC: 32.7 g/dL (ref 30.0–36.0)
MCV: 91.1 fL (ref 80.0–100.0)
Platelets: 206 10*3/uL (ref 150–400)
RBC: 5.14 MIL/uL — ABNORMAL HIGH (ref 3.87–5.11)
RDW: 13.7 % (ref 11.5–15.5)
WBC: 5.9 10*3/uL (ref 4.0–10.5)
nRBC: 0 % (ref 0.0–0.2)

## 2020-06-23 LAB — MAGNESIUM: Magnesium: 1.9 mg/dL (ref 1.7–2.4)

## 2020-06-23 MED ORDER — DILTIAZEM HCL ER COATED BEADS 120 MG PO CP24
120.0000 mg | ORAL_CAPSULE | Freq: Once | ORAL | Status: AC
  Administered 2020-06-23: 120 mg via ORAL
  Filled 2020-06-23: qty 1

## 2020-06-23 MED ORDER — SODIUM CHLORIDE 0.9 % IV BOLUS
500.0000 mL | Freq: Once | INTRAVENOUS | Status: AC
  Administered 2020-06-23: 500 mL via INTRAVENOUS

## 2020-06-23 MED ORDER — APIXABAN 5 MG PO TABS
5.0000 mg | ORAL_TABLET | Freq: Once | ORAL | Status: AC
  Administered 2020-06-23: 5 mg via ORAL
  Filled 2020-06-23: qty 1

## 2020-06-23 NOTE — ED Provider Notes (Signed)
[facility] EMERGENCY DEPARTMENT Provider Note   CSN: [id number] Arrival date & time: 06/23/20  4401     History Chief Complaint  Patient presents with  . Atrial Fibrillation    Deanna Hamilton is a 70 y.o. female.  HPI 70 year old female with a history of recently diagnosed atrial fibrillation, anxiety, hypertension, thyroid disease presents to the ER with complaints of dizziness, palpitations which began this morning.  She states that she was seen on the 12th of this month, was diagnosed with atrial fibrillation, started on Cardizem and Eliquis which she has been compliant with.  She reports that she has been in contact with Dr. Anne Fu office but does not have an appointment until February 2.  She feels as though she has been not adjusting well to the medication.  She reports testing negative for COVID-19 here in the ER, however her entire family had symptoms and she also had 5 days of cough, nonbloody diarrhea, weakness.  She has since improved however but still feels dehydrated.  Palpitations have now resolved here in the ER    Past Medical History:  Diagnosis Date  . A-fib (HCC)   . Anxiety   . Hypertension   . Thyroid disease     Patient Active Problem List   Diagnosis Date Noted  . Atrial fibrillation with controlled ventricular rate (HCC) 06/07/2020  . Fall at home, initial encounter 06/07/2020  . Close exposure to COVID-19 virus 06/07/2020  . Palpitations 02/24/2017  . Acquired hypothyroidism 02/24/2017  . Coronary artery calcification 02/24/2017  . Tobacco abuse 02/24/2017  . Other emphysema (HCC) 02/24/2017    Past Surgical History:  Procedure Laterality Date  . ABDOMINAL HYSTERECTOMY       OB History   No obstetric history on file.     No family history on file.  Social History   Tobacco Use  . Smoking status: Current Every Day Smoker    Packs/day: 0.50    Types: Cigarettes  . Smokeless tobacco: Current User  Vaping Use  .  Vaping Use: Never used  Substance Use Topics  . Alcohol use: No  . Drug use: No    Home Medications Prior to Admission medications   Medication Sig Start Date End Date Taking? Authorizing Provider  apixaban (ELIQUIS) 5 MG TABS tablet Take 1 tablet (5 mg total) by mouth 2 (two) times daily. 06/07/20 07/07/20  Fayrene Helper, PA-C  diltiazem (CARDIZEM CD) 120 MG 24 hr capsule Take 1 capsule (120 mg total) by mouth daily. 06/07/20   Fayrene Helper, PA-C  Famotidine (PEPCID PO) Take 1 tablet by mouth as needed (GERD).    [provider]  levothyroxine (SYNTHROID) 88 MCG tablet Take 88 mcg by mouth daily. 07/11/17   [provider]  metoprolol tartrate (LOPRESSOR) 25 MG tablet TAKE ONE TABLET BY MOUTH TWICE A DAY Patient taking differently: Take 25 mg by mouth 2 (two) times daily. 02/10/18 06/07/20  Jake Bathe, MD    Allergies    Erythromycin, Prednisone, and Xanax [alprazolam]  Review of Systems   Review of Systems  Constitutional: Negative for chills and fever.  HENT: Negative for ear pain and sore throat.   Eyes: Negative for pain and visual disturbance.  Respiratory: Negative for cough and shortness of breath.   Cardiovascular: Positive for palpitations. Negative for chest pain.  Gastrointestinal: Negative for abdominal pain and vomiting.  Genitourinary: Negative for dysuria and hematuria.  Musculoskeletal: Negative for arthralgias and back pain.  Skin: Negative  for color change and rash.  Neurological: Positive for dizziness. Negative for seizures and syncope.  All other systems reviewed and are negative.   Physical Exam Updated Vital Signs BP 118/77   Pulse 87   Temp 97.9 F (36.6 C) (Oral)   Resp (!) 23   Ht 5\' 5"  (1.651 m)   Wt 54.4 kg   LMP  (LMP Unknown)   SpO2 97%   BMI 19.97 kg/m   Physical Exam Vitals and nursing note reviewed.  Constitutional:      General: She is not in acute distress.    Appearance: She is well-developed and well-nourished.  She is not ill-appearing or diaphoretic.  HENT:     Head: Normocephalic and atraumatic.  Eyes:     Conjunctiva/sclera: Conjunctivae normal.  Cardiovascular:     Rate and Rhythm: Tachycardia present. Rhythm irregular.     Pulses:          Radial pulses are 2+ on the right side and 2+ on the left side.     Heart sounds: No murmur heard.   Pulmonary:     Effort: Pulmonary effort is normal. No respiratory distress.     Breath sounds: Normal breath sounds.  Abdominal:     Palpations: Abdomen is soft.     Tenderness: There is no abdominal tenderness.  Musculoskeletal:        General: No edema.     Cervical back: Normal range of motion and neck supple.     Right lower leg: No edema.     Left lower leg: No edema.  Skin:    General: Skin is warm and dry.  Neurological:     Mental Status: She is alert.  Psychiatric:        Mood and Affect: Mood and affect normal.     ED Results / Procedures / Treatments   Labs (all labs ordered are listed, but only abnormal results are displayed) Labs Reviewed  CBC - Abnormal; Notable for the following components:      Result Value   RBC 5.14 (*)    Hemoglobin 15.3 (*)    HCT 46.8 (*)    All other components within normal limits  COMPREHENSIVE METABOLIC PANEL - Abnormal; Notable for the following components:   Sodium 134 (*)    Glucose, Bld 100 (*)    Calcium 8.8 (*)    All other components within normal limits  MAGNESIUM    EKG EKG Interpretation  Date/Time:  Friday June 23 2020 10:06:43 EST Ventricular Rate:  110 PR Interval:    QRS Duration: 75 QT Interval:  284 QTC Calculation: 385 R Axis:   82 Text Interpretation: Atrial fibrillation Borderline right axis deviation Borderline repolarization abnormality Since last tracing rate slower Otherwise no significant change Confirmed by [phone]) on 06/23/2020 10:08:44 AM   Radiology DG Chest Portable 1 View  Result Date: 06/23/2020 CLINICAL DATA:  Awoke this morning with  heart palpitations. History of atrial fibrillation. EXAM: PORTABLE CHEST 1 VIEW COMPARISON:  06/07/2020. FINDINGS: Cardiac silhouette is top-normal in size. No mediastinal or hilar masses. Lungs demonstrate prominent bronchovascular markings, and are hyperexpanded. Mild stable scarring noted in both apices. No convincing pleural effusion and no pneumothorax. Skeletal structures are grossly intact. IMPRESSION: 1. No acute cardiopulmonary disease. 2. Mild cardiomegaly. Lung hyperexpansion consistent with COPD/emphysema. Electronically Signed   By: 08/05/2020 M.D.   On: 06/23/2020 10:33    Procedures Procedures   Medications Ordered in ED Medications  sodium  chloride 0.9 % bolus 500 mL (0 mLs Intravenous Stopped 06/23/20 1203)  diltiazem (CARDIZEM CD) 24 hr capsule 120 mg (120 mg Oral Given 06/23/20 1016)  apixaban (ELIQUIS) tablet 5 mg (5 mg Oral Given 06/23/20 1028)    ED Course  I have reviewed the triage vital signs and the nursing notes.  Pertinent labs & imaging results that were available during my care of the patient were reviewed by me and considered in my medical decision making (see chart for details).    MDM Rules/Calculators/A&P                          70 year old female presents to the ER with complaints of dizziness, palpitations in the setting of recent A. fib diagnosis on arrival patient is well-appearing, rate 110-115.  Speaking in full sentences not increased work of breathing.  No lower extremity edema, otherwise physical exam is benign.  CBC without any significant abnormalities, magnesium normal, CMP without any significant electrolyte abnormalities.  Chest x-ray with chronic COPD changes.  EKG with atrial fibrillation with a rate of 110.  Patient received 500 cc fluid bolus, Eliquis, home dose of Cardizem, rate improved to the upper 90s/ low 100s.  Consulted cardiology Dr. Royann Shivers about patient's medication regimen, he recommends increasing Cardizem to twice daily, and  having the patient check her blood pressure and heart rate once daily and writing it down to bring to her appointment to Dr. Anne Fu.  I discussed this with the patient and her daughter at bedside who both voiced understanding and are agreeable.  Return precautions discussed.  Stable for discharge at this time.  This was a shared visit with my supervising physician Dr. Adela Lank who independently saw and evaluated the patient & provided guidance in evaluation/management/disposition ,in agreement with care  Final Clinical Impression(s) / ED Diagnoses Final diagnoses:  Atrial fibrillation with RVR [facility])    Rx / DC Orders ED Discharge Orders    None       Mare Ferrari, PA-C 06/23/20 1357    Melene Plan, DO 06/23/20 1441

## 2020-06-23 NOTE — ED Triage Notes (Signed)
Pt bib ems from [facility]; woke with palpitations this am; has hx Afib; HR 110-150; denies sob; palpitations resolved at this time; denies CP, dizziness; ambulatory with ems  146/69 upper 80s to low 90s RA; placed on 2L, 94% CBG 217 RR 18-20

## 2020-06-23 NOTE — ED Notes (Signed)
Patient verbalizes understanding of discharge instructions. Opportunity for questioning and answers were provided. Pt discharged from ED. 

## 2020-06-23 NOTE — Discharge Instructions (Signed)
As discussed, please make sure to take your Cardizem twice daily until you see Dr. Anne Fu.  Your blood pressure once a day around the same time every day and please record your blood pressure and your heart rate leading up to your appointment with Dr. Anne Fu.  Please return to the ER for any new or worsening symptoms.

## 2020-06-28 ENCOUNTER — Encounter: Payer: Self-pay | Admitting: Cardiology

## 2020-06-28 ENCOUNTER — Ambulatory Visit: Payer: Medicare Other | Admitting: Cardiology

## 2020-06-28 ENCOUNTER — Other Ambulatory Visit: Payer: Self-pay

## 2020-06-28 ENCOUNTER — Encounter: Payer: Self-pay | Admitting: *Deleted

## 2020-06-28 VITALS — BP 130/80 | HR 95 | Ht 65.0 in | Wt 121.2 lb

## 2020-06-28 DIAGNOSIS — I4891 Unspecified atrial fibrillation: Secondary | ICD-10-CM

## 2020-06-28 MED ORDER — DILTIAZEM HCL ER COATED BEADS 120 MG PO CP24: 120.0000 mg | ORAL_CAPSULE | Freq: Two times a day (BID) | ORAL | 3 refills | Status: DC

## 2020-06-28 MED ORDER — APIXABAN 5 MG PO TABS: 5.0000 mg | ORAL_TABLET | Freq: Two times a day (BID) | ORAL | 11 refills | Status: DC

## 2020-06-28 NOTE — H&P (View-Only) (Signed)
Cardiology Office Note:    Date:  06/28/2020   ID:  Deanna Hamilton, DOB 08/16/1950, MRN [id number]  PCP:  Webb, Carol, MD  [facility] Cardiologist:  Jiah Bari, MD  [facility] Electrophysiologist:  None   Referring MD: Webb, Carol, MD     History of Present Illness:    Deanna Hamilton is a 69 y.o. female here for the follow-up of atrial fibrillation with rapid ventricular response seen in the emergency department on 06/23/2020 with heart rates of 110 on ECG personally reviewed.  Previously diagnosed with atrial fibrillation on 06/07/2020.  Has been on Cardizem as well as Eliquis.  Compliant.  She felt as though she is not adjusting well to the medication.  Her entire family has had a cough diarrhea weakness but she tested negative for Covid.  She received a fluid bolus in the ER and Dr. Crotioru was talked to recommended increasing Cardizem to twice a day and to maintain good blood pressure checks.  I used to take care of her husband Donald  She did recently have her Synthroid slightly increased to 88 mcg.  She is also had COVID through her house.  Past Medical History:  Diagnosis Date  . A-fib (HCC)   . Anxiety   . Hypertension   . Thyroid disease     Past Surgical History:  Procedure Laterality Date  . ABDOMINAL HYSTERECTOMY      Current Medications: Current Meds  Medication Sig  . Famotidine (PEPCID PO) Take 1 tablet by mouth as needed (GERD).  . levothyroxine (SYNTHROID) 88 MCG tablet Take 88 mcg by mouth daily.  . [DISCONTINUED] apixaban (ELIQUIS) 5 MG TABS tablet Take 1 tablet (5 mg total) by mouth 2 (two) times daily.  . [DISCONTINUED] diltiazem (CARDIZEM CD) 120 MG 24 hr capsule Take 1 capsule (120 mg total) by mouth daily. (Patient taking differently: No sig reported)     Allergies:   Erythromycin, Prednisone, and Xanax [alprazolam]   Social History   Socioeconomic History  . Marital status: Widowed    Spouse name: Not on file  . Number of  children: Not on file  . Years of education: Not on file  . Highest education level: Not on file  Occupational History  . Not on file  Tobacco Use  . Smoking status: Current Every Day Smoker    Packs/day: 0.50    Types: Cigarettes  . Smokeless tobacco: Current User  Vaping Use  . Vaping Use: Never used  Substance and Sexual Activity  . Alcohol use: No  . Drug use: No  . Sexual activity: Not on file  Other Topics Concern  . Not on file  Social History Narrative  . Not on file   Social Determinants of Health   Financial Resource Strain: Not on file  Food Insecurity: Not on file  Transportation Needs: Not on file  Physical Activity: Not on file  Stress: Not on file  Social Connections: Not on file     Family History: The patient's family history is not on file.  ROS:   Please see the history of present illness.     All other systems reviewed and are negative.  EKGs/Labs/Other Studies Reviewed:    The following studies were reviewed today:  ECHO 06/07/20: 1. Left ventricular ejection fraction, by estimation, is 55 to 60%. The  left ventricle has normal function. The left ventricle has no regional  wall motion abnormalities. Left ventricular diastolic parameters are  indeterminate.  2. Right   ventricular systolic function is normal. The right ventricular  size is normal.  3. Left atrial size was mildly dilated.  4. Right atrial size was mildly dilated.  5. The mitral valve is normal in structure. Mild mitral valve  regurgitation. No evidence of mitral stenosis.  6. Tricuspid valve regurgitation is mild to moderate.  7. The aortic valve was not well visualized. Aortic valve regurgitation  is mild. Mild to moderate aortic valve sclerosis/calcification is present,  without any evidence of aortic stenosis.  8. The inferior vena cava is normal in size with greater than 50%  respiratory variability, suggesting right atrial pressure of 3 mmHg.   EKG:  EKG is   ordered today.  The ekg ordered today demonstrates atrial fibrillation 95 EKG on 06/23/2020 showed A. fib with rapid ventricular response 111.  Recent Labs: 06/07/2020: B Natriuretic Peptide 170.0; TSH 2.081 06/23/2020: ALT 15; BUN 10; Creatinine, Ser 0.78; Hemoglobin 15.3; Magnesium 1.9; Platelets 206; Potassium 3.8; Sodium 134  Recent Lipid Panel    Component Value Date/Time   CHOL 150 06/07/2020 1434   TRIG 39 06/07/2020 1434   HDL 53 06/07/2020 1434   CHOLHDL 2.8 06/07/2020 1434   VLDL 8 06/07/2020 1434   LDLCALC 89 06/07/2020 1434     Risk Assessment/Calculations:      Physical Exam:    VS:  BP 130/80 (BP Location: Left Arm, Patient Position: Sitting, Cuff Size: Normal)   Pulse 95   Ht 5\' 5"  (1.651 m)   Wt 121 lb 4 oz (55 kg)   LMP  (LMP Unknown)   SpO2 96%   BMI 20.18 kg/m     Wt Readings from Last 3 Encounters:  06/28/20 121 lb 4 oz (55 kg)  06/23/20 120 lb (54.4 kg)  08/25/17 117 lb 1.9 oz (53.1 kg)     GEN:  Well nourished, well developed in no acute distress HEENT: Normal NECK: No JVD; No carotid bruits LYMPHATICS: No lymphadenopathy CARDIAC: irreg, no murmurs, rubs, gallops RESPIRATORY:  Clear to auscultation without rales, wheezing or rhonchi  ABDOMEN: Soft, non-tender, non-distended MUSCULOSKELETAL:  No edema; No deformity  SKIN: Warm and dry NEUROLOGIC:  Alert and oriented x 3 PSYCHIATRIC:  Normal affect   ASSESSMENT:    1. Atrial fibrillation with controlled ventricular rate (HCC)    PLAN:    In order of problems listed above:  Persistent atrial fibrillation discovered on 06/07/2020 in the emergency room -Echocardiogram reassuring. -Continue with Eliquis and Cardizem as above.  She has not missed any doses of Eliquis since 06/07/2020.  She is 3 weeks out. -We will go ahead and set her up for DC cardioversion.  Risk benefits explained, she is willing to proceed.  I think she would be a good candidate for this. -She has had household Covid.   Previously she tested negative on the 1/12.  Of course we will test her prior to the cardioversion.  If she test positive then we will need full protocol for delaying therapy.       Medication Adjustments/Labs and Tests Ordered: Current medicines are reviewed at length with the patient today.  Concerns regarding medicines are outlined above.  Orders Placed This Encounter  Procedures  . EKG 12-Lead   Meds ordered this encounter  Medications  . diltiazem (CARDIZEM CD) 120 MG 24 hr capsule    Sig: Take 1 capsule (120 mg total) by mouth 2 (two) times daily.    Dispense:  180 capsule    Refill:  3  .  apixaban (ELIQUIS) 5 MG TABS tablet    Sig: Take 1 tablet (5 mg total) by mouth 2 (two) times daily.    Dispense:  60 tablet    Refill:  11    Patient Instructions  Medication Instructions:  The current medical regimen is effective;  continue present plan and medications.  *If you need a refill on your cardiac medications before your next appointment, please call your pharmacy*  Testing/Procedures: Your physician has requested that you have a Cardioversion.  Electrical Cardioversion uses a jolt of electricity to your heart either through paddles or wired patches attached to your chest. This is a controlled, usually prescheduled, procedure. This procedure is done at the hospital and you are not awake during the procedure. You usually go home the day of the procedure. Please see the instruction sheet given to you today for more information.  Follow-Up: At [facility], you and your health needs are our priority.  As part of our continuing mission to provide you with exceptional heart care, we have created designated Provider Care Teams.  These Care Teams include your primary Cardiologist (physician) and Advanced Practice Providers (APPs -  Physician Assistants and Nurse Practitioners) who all work together to provide you with the care you need, when you need it.  We recommend signing up  for the patient portal called "MyChart".  Sign up information is provided on this After Visit Summary.  MyChart is used to connect with patients for Virtual Visits (Telemedicine).  Patients are able to view lab/test results, encounter notes, upcoming appointments, etc.  Non-urgent messages can be sent to your provider as well.   To learn more about what you can do with MyChart, go to [email or web address].    Your next appointment:   4-6 week(s)  The format for your next appointment:   In Person  Provider:   Donato Schultz, MD  Thank you for choosing [facility]!!         Signed, Donato Schultz, MD  06/28/2020 5:11 PM    [facility]

## 2020-06-28 NOTE — Patient Instructions (Signed)
Medication Instructions:  The current medical regimen is effective;  continue present plan and medications.  *If you need a refill on your cardiac medications before your next appointment, please call your pharmacy*  Testing/Procedures: Your physician has requested that you have a Cardioversion.  Electrical Cardioversion uses a jolt of electricity to your heart either through paddles or wired patches attached to your chest. This is a controlled, usually prescheduled, procedure. This procedure is done at the hospital and you are not awake during the procedure. You usually go home the day of the procedure. Please see the instruction sheet given to you today for more information.  Follow-Up: At [facility], you and your health needs are our priority.  As part of our continuing mission to provide you with exceptional heart care, we have created designated Provider Care Teams.  These Care Teams include your primary Cardiologist (physician) and Advanced Practice Providers (APPs -  Physician Assistants and Nurse Practitioners) who all work together to provide you with the care you need, when you need it.  We recommend signing up for the patient portal called "MyChart".  Sign up information is provided on this After Visit Summary.  MyChart is used to connect with patients for Virtual Visits (Telemedicine).  Patients are able to view lab/test results, encounter notes, upcoming appointments, etc.  Non-urgent messages can be sent to your provider as well.   To learn more about what you can do with MyChart, go to [email or web address].    Your next appointment:   4-6 week(s)  The format for your next appointment:   In Person  Provider:   Donato Schultz, MD  Thank you for choosing [facility]!!

## 2020-06-28 NOTE — Addendum Note (Signed)
Addended by: Jake Bathe on: 06/28/2020 05:29 PM   Modules accepted: Orders

## 2020-06-28 NOTE — Addendum Note (Signed)
Addended by: Jake Bathe on: 06/28/2020 05:27 PM   Modules accepted: Orders, SmartSet

## 2020-06-28 NOTE — Progress Notes (Signed)
Cardiology Office Note:    Date:  06/28/2020   ID:  Deanna Hamilton, DOB [birth year], MRN [id number]  PCP:  Shirlean Mylar, MD  [facility] Cardiologist:  Donato Schultz, MD  [facility] Electrophysiologist:  None   Referring MD: Shirlean Mylar, MD     History of Present Illness:    Deanna Hamilton is a 70 y.o. female here for the follow-up of atrial fibrillation with rapid ventricular response seen in the emergency department on 06/23/2020 with heart rates of 110 on ECG personally reviewed.  Previously diagnosed with atrial fibrillation on 06/07/2020.  Has been on Cardizem as well as Eliquis.  Compliant.  She felt as though she is not adjusting well to the medication.  Her entire family has had a cough diarrhea weakness but she tested negative for Covid.  She received a fluid bolus in the ER and Dr. Vickii Chafe was talked to recommended increasing Cardizem to twice a day and to maintain good blood pressure checks.  I used to take care of her husband Deanna Hamilton  She did recently have her Synthroid slightly increased to 88 mcg.  She is also had COVID through her house.  Past Medical History:  Diagnosis Date  . A-fib (HCC)   . Anxiety   . Hypertension   . Thyroid disease     Past Surgical History:  Procedure Laterality Date  . ABDOMINAL HYSTERECTOMY      Current Medications: Current Meds  Medication Sig  . Famotidine (PEPCID PO) Take 1 tablet by mouth as needed (GERD).  Marland Kitchen levothyroxine (SYNTHROID) 88 MCG tablet Take 88 mcg by mouth daily.  . [DISCONTINUED] apixaban (ELIQUIS) 5 MG TABS tablet Take 1 tablet (5 mg total) by mouth 2 (two) times daily.  . [DISCONTINUED] diltiazem (CARDIZEM CD) 120 MG 24 hr capsule Take 1 capsule (120 mg total) by mouth daily. (Patient taking differently: No sig reported)     Allergies:   Erythromycin, Prednisone, and Xanax [alprazolam]   Social History   Socioeconomic History  . Marital status: Widowed    Spouse name: Not on file  . Number of  children: Not on file  . Years of education: Not on file  . Highest education level: Not on file  Occupational History  . Not on file  Tobacco Use  . Smoking status: Current Every Day Smoker    Packs/day: 0.50    Types: Cigarettes  . Smokeless tobacco: Current User  Vaping Use  . Vaping Use: Never used  Substance and Sexual Activity  . Alcohol use: No  . Drug use: No  . Sexual activity: Not on file  Other Topics Concern  . Not on file  Social History Narrative  . Not on file   Social Determinants of Health   Financial Resource Strain: Not on file  Food Insecurity: Not on file  Transportation Needs: Not on file  Physical Activity: Not on file  Stress: Not on file  Social Connections: Not on file     Family History: The patient's family history is not on file.  ROS:   Please see the history of present illness.     All other systems reviewed and are negative.  EKGs/Labs/Other Studies Reviewed:    The following studies were reviewed today:  ECHO 06/07/20: 1. Left ventricular ejection fraction, by estimation, is 55 to 60%. The  left ventricle has normal function. The left ventricle has no regional  wall motion abnormalities. Left ventricular diastolic parameters are  indeterminate.  2. Right  ventricular systolic function is normal. The right ventricular  size is normal.  3. Left atrial size was mildly dilated.  4. Right atrial size was mildly dilated.  5. The mitral valve is normal in structure. Mild mitral valve  regurgitation. No evidence of mitral stenosis.  6. Tricuspid valve regurgitation is mild to moderate.  7. The aortic valve was not well visualized. Aortic valve regurgitation  is mild. Mild to moderate aortic valve sclerosis/calcification is present,  without any evidence of aortic stenosis.  8. The inferior vena cava is normal in size with greater than 50%  respiratory variability, suggesting right atrial pressure of 3 mmHg.   EKG:  EKG is   ordered today.  The ekg ordered today demonstrates atrial fibrillation 95 EKG on 06/23/2020 showed A. fib with rapid ventricular response 111.  Recent Labs: 06/07/2020: B Natriuretic Peptide 170.0; TSH 2.081 06/23/2020: ALT 15; BUN 10; Creatinine, Ser 0.78; Hemoglobin 15.3; Magnesium 1.9; Platelets 206; Potassium 3.8; Sodium 134  Recent Lipid Panel    Component Value Date/Time   CHOL 150 06/07/2020 1434   TRIG 39 06/07/2020 1434   HDL 53 06/07/2020 1434   CHOLHDL 2.8 06/07/2020 1434   VLDL 8 06/07/2020 1434   LDLCALC 89 06/07/2020 1434     Risk Assessment/Calculations:      Physical Exam:    VS:  BP 130/80 (BP Location: Left Arm, Patient Position: Sitting, Cuff Size: Normal)   Pulse 95   Ht 5\' 5"  (1.651 m)   Wt 121 lb 4 oz (55 kg)   LMP  (LMP Unknown)   SpO2 96%   BMI 20.18 kg/m     Wt Readings from Last 3 Encounters:  06/28/20 121 lb 4 oz (55 kg)  06/23/20 120 lb (54.4 kg)  08/25/17 117 lb 1.9 oz (53.1 kg)     GEN:  Well nourished, well developed in no acute distress HEENT: Normal NECK: No JVD; No carotid bruits LYMPHATICS: No lymphadenopathy CARDIAC: irreg, no murmurs, rubs, gallops RESPIRATORY:  Clear to auscultation without rales, wheezing or rhonchi  ABDOMEN: Soft, non-tender, non-distended MUSCULOSKELETAL:  No edema; No deformity  SKIN: Warm and dry NEUROLOGIC:  Alert and oriented x 3 PSYCHIATRIC:  Normal affect   ASSESSMENT:    1. Atrial fibrillation with controlled ventricular rate (HCC)    PLAN:    In order of problems listed above:  Persistent atrial fibrillation discovered on 06/07/2020 in the emergency room -Echocardiogram reassuring. -Continue with Eliquis and Cardizem as above.  She has not missed any doses of Eliquis since 06/07/2020.  She is 3 weeks out. -We will go ahead and set her up for DC cardioversion.  Risk benefits explained, she is willing to proceed.  I think she would be a good candidate for this. -She has had household Covid.   Previously she tested negative on the 1/12.  Of course we will test her prior to the cardioversion.  If she test positive then we will need full protocol for delaying therapy.       Medication Adjustments/Labs and Tests Ordered: Current medicines are reviewed at length with the patient today.  Concerns regarding medicines are outlined above.  Orders Placed This Encounter  Procedures  . EKG 12-Lead   Meds ordered this encounter  Medications  . diltiazem (CARDIZEM CD) 120 MG 24 hr capsule    Sig: Take 1 capsule (120 mg total) by mouth 2 (two) times daily.    Dispense:  180 capsule    Refill:  3  .  apixaban (ELIQUIS) 5 MG TABS tablet    Sig: Take 1 tablet (5 mg total) by mouth 2 (two) times daily.    Dispense:  60 tablet    Refill:  11    Patient Instructions  Medication Instructions:  The current medical regimen is effective;  continue present plan and medications.  *If you need a refill on your cardiac medications before your next appointment, please call your pharmacy*  Testing/Procedures: Your physician has requested that you have a Cardioversion.  Electrical Cardioversion uses a jolt of electricity to your heart either through paddles or wired patches attached to your chest. This is a controlled, usually prescheduled, procedure. This procedure is done at the hospital and you are not awake during the procedure. You usually go home the day of the procedure. Please see the instruction sheet given to you today for more information.  Follow-Up: At [facility], you and your health needs are our priority.  As part of our continuing mission to provide you with exceptional heart care, we have created designated Provider Care Teams.  These Care Teams include your primary Cardiologist (physician) and Advanced Practice Providers (APPs -  Physician Assistants and Nurse Practitioners) who all work together to provide you with the care you need, when you need it.  We recommend signing up  for the patient portal called "MyChart".  Sign up information is provided on this After Visit Summary.  MyChart is used to connect with patients for Virtual Visits (Telemedicine).  Patients are able to view lab/test results, encounter notes, upcoming appointments, etc.  Non-urgent messages can be sent to your provider as well.   To learn more about what you can do with MyChart, go to [email or web address].    Your next appointment:   4-6 week(s)  The format for your next appointment:   In Person  Provider:   Donato Schultz, MD  Thank you for choosing [facility]!!         Signed, Donato Schultz, MD  06/28/2020 5:11 PM    [facility]

## 2020-06-29 DIAGNOSIS — E039 Hypothyroidism, unspecified: Secondary | ICD-10-CM | POA: Diagnosis not present

## 2020-07-04 ENCOUNTER — Other Ambulatory Visit (HOSPITAL_COMMUNITY)
Admission: RE | Admit: 2020-07-04 | Discharge: 2020-07-04 | Disposition: A | Discharge status: Home / Self Care | Payer: Medicare Other | Source: Ambulatory Visit | Attending: Cardiology | Admitting: Cardiology

## 2020-07-04 DIAGNOSIS — U071 COVID-19: Secondary | ICD-10-CM | POA: Diagnosis not present

## 2020-07-04 DIAGNOSIS — Z01812 Encounter for preprocedural laboratory examination: Secondary | ICD-10-CM | POA: Insufficient documentation

## 2020-07-04 LAB — SARS CORONAVIRUS 2 (TAT 6-24 HRS): SARS Coronavirus 2: POSITIVE — AB

## 2020-07-05 ENCOUNTER — Telehealth: Payer: Self-pay | Admitting: Adult Health

## 2020-07-05 ENCOUNTER — Telehealth: Payer: Self-pay | Admitting: Cardiology

## 2020-07-05 NOTE — Telephone Encounter (Signed)
    Ms. Schlafer PCR Covid test was positive on 07/04/2020.  Her original PCR test on 06/07/2020 was negative.  She then started to develop symptoms on 06/10/20 and was sick "in bed "for 5 days until 06/15/2020.  Her whole house had COVID.  She did not get retested until we PCR tested her in anticipation for cardioversion.  Clearly she had previous active Covid infection during the 15th through 20 January.  Currently she is asymptomatic.  I spoke to endoscopy.  It is hospital policy for [place] to reschedule her for 10 days after her positive PCR test.  We will go ahead and reschedule her DC cardioversion for 10 days after 07/04/2020.  She does not need further PCR testing.  I spoke with Ms. Christensen on the phone regarding this matter.  She understands.  Donato Schultz, MD

## 2020-07-05 NOTE — Telephone Encounter (Signed)
Called to discuss with patient about COVID-19 symptoms and the use of one of the available treatments for those with mild to moderate Covid symptoms and at a high risk of hospitalization.  Pt appears to qualify for outpatient treatment due to co-morbid conditions and/or a member of an at-risk group in accordance with the FDA Emergency Use Authorization.    Patient had COVID 06/09/20.  Recovered.  Does not qualify at this time.    Deanna Hamilton

## 2020-07-05 NOTE — Telephone Encounter (Signed)
Pt has been rescheduled for outpt cardioversion on 2/21 with Dr Izora Ribas at 10 am.  Pt is aware and will arrive at 8:30 am.  She will c/b prior to then if any questions or concerns.

## 2020-07-05 NOTE — Telephone Encounter (Signed)
Lequita Halt RN with Pre Procedure covid testing calling to inform the patient tested positive for covid.

## 2020-07-05 NOTE — Telephone Encounter (Signed)
Noted - procedure to be rescheduled.

## 2020-07-05 NOTE — Progress Notes (Signed)
Spoke with Selena at [facility] regarding pt's covid test results. MD will be made aware. Left callback number if needed.  Viviano Simas, RN

## 2020-07-06 ENCOUNTER — Ambulatory Visit (HOSPITAL_COMMUNITY): Admission: RE | Admit: 2020-07-06 | Payer: Medicare Other | Source: Home / Self Care | Admitting: Cardiology

## 2020-07-06 ENCOUNTER — Encounter (HOSPITAL_COMMUNITY): Admission: RE | Payer: Self-pay | Source: Home / Self Care

## 2020-07-06 SURGERY — CARDIOVERSION
Anesthesia: Monitor Anesthesia Care

## 2020-07-17 ENCOUNTER — Ambulatory Visit (HOSPITAL_COMMUNITY): Payer: Medicare Other | Admitting: Anesthesiology

## 2020-07-17 ENCOUNTER — Telehealth: Payer: Self-pay | Admitting: Cardiology

## 2020-07-17 ENCOUNTER — Other Ambulatory Visit: Payer: Self-pay

## 2020-07-17 ENCOUNTER — Encounter (HOSPITAL_COMMUNITY): Payer: Self-pay | Admitting: Internal Medicine

## 2020-07-17 ENCOUNTER — Ambulatory Visit (HOSPITAL_COMMUNITY)
Admission: RE | Admit: 2020-07-17 | Discharge: 2020-07-17 | Disposition: A | Discharge status: Home / Self Care | Payer: Medicare Other | Attending: Internal Medicine | Admitting: Internal Medicine

## 2020-07-17 ENCOUNTER — Encounter (HOSPITAL_COMMUNITY)
Admission: RE | Disposition: A | Discharge status: Home / Self Care | Payer: Self-pay | Source: Home / Self Care | Attending: Internal Medicine

## 2020-07-17 DIAGNOSIS — Z79890 Hormone replacement therapy (postmenopausal): Secondary | ICD-10-CM | POA: Diagnosis not present

## 2020-07-17 DIAGNOSIS — Z79899 Other long term (current) drug therapy: Secondary | ICD-10-CM | POA: Insufficient documentation

## 2020-07-17 DIAGNOSIS — F17210 Nicotine dependence, cigarettes, uncomplicated: Secondary | ICD-10-CM | POA: Insufficient documentation

## 2020-07-17 DIAGNOSIS — J449 Chronic obstructive pulmonary disease, unspecified: Secondary | ICD-10-CM | POA: Diagnosis not present

## 2020-07-17 DIAGNOSIS — E039 Hypothyroidism, unspecified: Secondary | ICD-10-CM | POA: Diagnosis not present

## 2020-07-17 DIAGNOSIS — Z7901 Long term (current) use of anticoagulants: Secondary | ICD-10-CM | POA: Diagnosis not present

## 2020-07-17 DIAGNOSIS — I4819 Other persistent atrial fibrillation: Secondary | ICD-10-CM | POA: Diagnosis not present

## 2020-07-17 DIAGNOSIS — I10 Essential (primary) hypertension: Secondary | ICD-10-CM | POA: Diagnosis not present

## 2020-07-17 DIAGNOSIS — I4891 Unspecified atrial fibrillation: Secondary | ICD-10-CM | POA: Diagnosis not present

## 2020-07-17 HISTORY — DX: Other specified postprocedural states: R11.2

## 2020-07-17 HISTORY — DX: Other complications of anesthesia, initial encounter: T88.59XA

## 2020-07-17 HISTORY — DX: Hypothyroidism, unspecified: E03.9

## 2020-07-17 HISTORY — DX: Nausea with vomiting, unspecified: R11.2

## 2020-07-17 HISTORY — PX: CARDIOVERSION: SHX1299

## 2020-07-17 HISTORY — DX: Other specified postprocedural states: Z98.890

## 2020-07-17 SURGERY — CARDIOVERSION
Anesthesia: General

## 2020-07-17 MED ORDER — SODIUM CHLORIDE 0.9 % IV SOLN: INTRAVENOUS | Status: DC

## 2020-07-17 MED ORDER — LIDOCAINE 2% (20 MG/ML) 5 ML SYRINGE
INTRAMUSCULAR | Status: DC | PRN
  Administered 2020-07-17: 20 mg via INTRAVENOUS

## 2020-07-17 MED ORDER — PROPOFOL 10 MG/ML IV BOLUS
INTRAVENOUS | Status: DC | PRN
  Administered 2020-07-17: 80 mg via INTRAVENOUS

## 2020-07-17 NOTE — Telephone Encounter (Signed)
° ° °  Pt would like to speak with Dr. Minerva Fester nurse, she said she forgot what the doctor told her after her cardioversion today, she's wondering if Pam knows it

## 2020-07-17 NOTE — Anesthesia Postprocedure Evaluation (Signed)
Anesthesia Post Note  Patient: Deanna Hamilton  Procedure(s) Performed: CARDIOVERSION (N/A )     Patient location during evaluation: Endoscopy Anesthesia Type: General Level of consciousness: awake and alert Pain management: pain level controlled Vital Signs Assessment: post-procedure vital signs reviewed and stable Respiratory status: spontaneous breathing, nonlabored ventilation, respiratory function stable and patient connected to nasal cannula oxygen Cardiovascular status: blood pressure returned to baseline and stable Postop Assessment: no apparent nausea or vomiting Anesthetic complications: no   No complications documented.  Last Vitals:  Vitals:   07/17/20 1009 07/17/20 1019  BP: 126/75 (!) 111/96  Pulse: (!) 139 84  Resp: (!) 25 (!) 22  Temp:    SpO2: 96% 95%    Last Pain:  Vitals:   07/17/20 1019  TempSrc:   PainSc: 0-No pain                 Rhema Boyett COKER

## 2020-07-17 NOTE — Interval H&P Note (Signed)
History and Physical Interval Note:  07/17/2020 9:49 AM  Deanna Hamilton  has presented today for surgery, with the diagnosis of AFIB.  The various methods of treatment have been discussed with the patient and family. After consideration of risks, benefits and other options for treatment, the patient has consented to  Procedure(s): CARDIOVERSION (N/A) as a surgical intervention.  The patient's history has been reviewed, patient examined, no change in status, stable for surgery.  I have reviewed the patient's chart and labs.  Questions were answered to the patient's satisfaction.     Marvina Danner A Soha Thorup

## 2020-07-17 NOTE — Telephone Encounter (Signed)
Returned call to Pt.  Advised Pt of following previously discussed with Pt after cardioversion:  "Successful cardioversion of atrial fibrillation with quick return; now intermittent atrial fibrillation.  Through evaluation she had had:  AF RVR, Atrail Tachycardia rate 125, and Sinus Rhythm.  Presently in AF < 110 BPM.  Discussed starting AAD with the patient; she is asymptomatic and would prefer to wait until she sees Dr. Anne Fu." Dr. Izora Ribas   Briefly discuss ablation and AAD with Pt.  She would like to discuss with Dr. Anne Fu with her daughter.  Appt moved to 07/20/20 to discuss further options.

## 2020-07-17 NOTE — Transfer of Care (Signed)
Immediate Anesthesia Transfer of Care Note  Patient: Deanna Hamilton  Procedure(s) Performed: CARDIOVERSION (N/A )  Patient Location: Endoscopy Unit  Anesthesia Type:General  Level of Consciousness: drowsy and patient cooperative  Airway & Oxygen Therapy: Patient Spontanous Breathing  Post-op Assessment: Report given to RN, Post -op Vital signs reviewed and stable and Patient moving all extremities X 4  Post vital signs: Reviewed and stable  Last Vitals:  Vitals Value Taken Time  BP    Temp    Pulse    Resp    SpO2      Last Pain:  Vitals:   07/17/20 0913  TempSrc: Oral  PainSc: 0-No pain         Complications: No complications documented.

## 2020-07-17 NOTE — CV Procedure (Signed)
   Electrical Cardioversion Procedure Note Deanna Hamilton [id number] 01/07/1951  Procedure: Electrical Cardioversion Indications:  Atrial Fibrillation  Time Out: Verified patient identification, verified procedure,medications/allergies/relevent history reviewed, required imaging and test results available.  Performed  Procedure Details  The patient was NPO after midnight. Anesthesia was administered at the beside  by Dr. Noreene Larsson with 20mg  of lidocaine and 80 mg propofol.  Cardioversion was done with synchronized biphasic defibrillation with AP pads with 200 Joules.  The patient converted to normal sinus rhythm- but returned within three heart beats (after both cardioversions. The patient tolerated the procedure well.  Through discussing the results with this patient she has had both AF at rates of 150 and two spontaneous cardioversion to normal rhythm  IMPRESSION:  Successful cardioversion of atrial fibrillation with quick return; now intermittent atrial fibrillation.  Through evaluation she had had:  AF RVR, Atrail Tachycardia rate 125, and Sinus Rhythm.  Presently in AF < 110 BPM.  Discussed starting AAD with the patient; she is asymptomatic and would prefer to wait until she sees Dr. .  Anne Fu, MD    Deanna Hamilton 07/17/2020, 10:06 AM

## 2020-07-17 NOTE — Anesthesia Preprocedure Evaluation (Addendum)
Anesthesia Evaluation  Patient identified by MRN, date of birth, ID band Patient awake    Reviewed: Allergy & Precautions, H&P , NPO status , Patient's Chart, lab work & pertinent test results  History of Anesthesia Complications (+) PONV and history of anesthetic complications  Airway Mallampati: II  TM Distance: >3 FB Neck ROM: Full    Dental no notable dental hx. (+) Teeth Intact   Pulmonary COPD, Current Smoker,    Pulmonary exam normal breath sounds clear to auscultation       Cardiovascular Exercise Tolerance: Good hypertension, Normal cardiovascular exam+ dysrhythmias Atrial Fibrillation  Rhythm:Regular Rate:Normal     Neuro/Psych Anxiety negative neurological ROS     GI/Hepatic negative GI ROS, Neg liver ROS,   Endo/Other  Hypothyroidism   Renal/GU negative Renal ROS  negative genitourinary   Musculoskeletal negative musculoskeletal ROS (+)   Abdominal   Peds negative pediatric ROS (+)  Hematology negative hematology ROS (+)   Anesthesia Other Findings   Reproductive/Obstetrics negative OB ROS                           Anesthesia Physical Anesthesia Plan  ASA: III  Anesthesia Plan: General   Post-op Pain Management:    Induction: Intravenous  PONV Risk Score and Plan: 2 and Treatment may vary due to age or medical condition, Propofol infusion and TIVA  Airway Management Planned: Mask  Additional Equipment: None  Intra-op Plan:   Post-operative Plan:   Informed Consent: I have reviewed the patients History and Physical, chart, labs and discussed the procedure including the risks, benefits and alternatives for the proposed anesthesia with the patient or authorized representative who has indicated his/her understanding and acceptance.       Plan Discussed with: CRNA and Anesthesiologist  Anesthesia Plan Comments:         Anesthesia Quick Evaluation

## 2020-07-18 ENCOUNTER — Encounter (HOSPITAL_COMMUNITY): Payer: Self-pay | Admitting: Internal Medicine

## 2020-07-18 DIAGNOSIS — R059 Cough, unspecified: Secondary | ICD-10-CM | POA: Diagnosis not present

## 2020-07-20 ENCOUNTER — Ambulatory Visit: Payer: Medicare Other | Admitting: Cardiology

## 2020-08-08 ENCOUNTER — Ambulatory Visit: Payer: Medicare Other | Admitting: Cardiology

## 2020-08-08 ENCOUNTER — Telehealth: Payer: Self-pay | Admitting: Cardiology

## 2020-08-08 NOTE — Telephone Encounter (Signed)
   [facility] Pre-operative Risk Assessment    HEARTCARE STAFF: - Please ensure there is not already an duplicate clearance open for this procedure. - Under Visit Info/Reason for Call, type in Other and utilize the format Clearance MM/DD/YY or Clearance TBD. Do not use dashes or single digits. - If request is for dental extraction, please clarify the # of teeth to be extracted.  Request for surgical clearance:  1. What type of surgery is being performed? 1 tooth extracted    2. When is this surgery scheduled? Pt is in the chair    3. What type of clearance is required (medical clearance vs. Pharmacy clearance to hold med vs. Both)? Pharmacy   Are there any medications that need to be held prior to surgery and how long? apixaban (ELIQUIS) 5 MG TABS tablet [[id number]]    4. Practice name and name of physician performing surgery? Dr Norberta Keens    5. What is the office phone number? [phone]   7.   What is the office fax number? [phone]  8.   Anesthesia type (None, local, MAC, general) ? Local    Deanna Hamilton 08/08/2020, 10:19 AM  _________________________________________________________________   (provider comments below)

## 2020-08-08 NOTE — Telephone Encounter (Signed)
Advised pt does NOT need to stop blood thinner for a single tooth extraction.

## 2020-08-16 ENCOUNTER — Other Ambulatory Visit: Payer: Self-pay

## 2020-08-16 ENCOUNTER — Ambulatory Visit: Payer: Medicare Other | Admitting: Physician Assistant

## 2020-08-16 ENCOUNTER — Encounter: Payer: Self-pay | Admitting: Physician Assistant

## 2020-08-16 VITALS — BP 132/88 | HR 168 | Ht 65.0 in | Wt 119.4 lb

## 2020-08-16 DIAGNOSIS — I2584 Coronary atherosclerosis due to calcified coronary lesion: Secondary | ICD-10-CM | POA: Diagnosis not present

## 2020-08-16 DIAGNOSIS — I2510 Atherosclerotic heart disease of native coronary artery without angina pectoris: Secondary | ICD-10-CM

## 2020-08-16 DIAGNOSIS — I4892 Unspecified atrial flutter: Secondary | ICD-10-CM

## 2020-08-16 DIAGNOSIS — I4891 Unspecified atrial fibrillation: Secondary | ICD-10-CM | POA: Diagnosis not present

## 2020-08-16 MED ORDER — FLECAINIDE ACETATE 100 MG PO TABS: 100.0000 mg | ORAL_TABLET | Freq: Two times a day (BID) | ORAL | 0 refills | Status: DC

## 2020-08-16 MED ORDER — DILTIAZEM HCL 30 MG PO TABS: 30.0000 mg | ORAL_TABLET | Freq: Three times a day (TID) | ORAL | 0 refills | Status: DC | PRN

## 2020-08-16 NOTE — Progress Notes (Signed)
Cardiology Office Note:    Date:  08/16/2020   ID:  Deanna Hamilton, DOB Oct 21, 1950, MRN [id number]  PCP:  Shirlean Mylar, MD  [facility] Cardiologist:  Donato Schultz, MD  [facility] Electrophysiologist:  None   Chief Complaint: Cardioversion follow-up  History of Present Illness:    Deanna Hamilton is a 70 y.o. female with a hx of persistent atrial fibrillation and thyroid disease seen for follow-up.  Recently seen in the emergency room for atrial fibrillation with rapid ventricular rate June 23, 2020.  Placed on Cardizem with up titration.  She also had COVID in January and flu in February.  Her thyroid adjusted as well.  Patient underwent successful cardioversion with quick return 07/17/20. "Through evaluation she had had:  AF RVR, Atrail Tachycardia rate 125, and Sinus Rhythm.  Presently in AF < 110 BPM.  Discussed starting AAD with the patient; she is asymptomatic and would prefer to wait until she sees Dr. Anne Fu. Christell Constant, MD".  Here today for follow up with daughter.  Denies palpitation, chest pain, shortness of breath, orthopnea, PND, syncope, lower extremity edema or melena.  Reports fatigue and still recovering from respiratory issue.  Intermittent cough.  Past Medical History:  Diagnosis Date  . A-fib (HCC)   . Anxiety   . Complication of anesthesia   . Hypertension   . Hypothyroidism   . PONV (postoperative nausea and vomiting)   . Thyroid disease     Past Surgical History:  Procedure Laterality Date  . ABDOMINAL HYSTERECTOMY    . CARDIOVERSION N/A 07/17/2020   Procedure: CARDIOVERSION;  Surgeon: Christell Constant, MD;  Location: [facility];  Service: Cardiovascular;  Laterality: N/A;    Current Medications: Current Meds  Medication Sig  . apixaban (ELIQUIS) 5 MG TABS tablet Take 1 tablet (5 mg total) by mouth 2 (two) times daily.  Marland Kitchen diltiazem (CARDIZEM CD) 120 MG 24 hr capsule Take 1 capsule (120 mg total) by mouth 2 (two) times  daily.  Marland Kitchen diltiazem (CARDIZEM) 30 MG tablet Take 1 tablet (30 mg total) by mouth every 8 (eight) hours as needed.  . famotidine (PEPCID) 10 MG tablet Take 10 mg by mouth 2 (two) times daily as needed for heartburn or indigestion.  . flecainide (TAMBOCOR) 100 MG tablet Take 1 tablet (100 mg total) by mouth 2 (two) times daily.  Marland Kitchen ibuprofen (ADVIL) 200 MG tablet Take 200 mg by mouth every 8 (eight) hours as needed (PAIN).  Marland Kitchen levothyroxine (SYNTHROID) 88 MCG tablet Take 88 mcg by mouth daily before breakfast.     Allergies:   Erythromycin, Prednisone, and Xanax [alprazolam]   Social History   Socioeconomic History  . Marital status: Widowed    Spouse name: Not on file  . Number of children: Not on file  . Years of education: Not on file  . Highest education level: Not on file  Occupational History  . Not on file  Tobacco Use  . Smoking status: Current Every Day Smoker    Packs/day: 0.50    Types: Cigarettes  . Smokeless tobacco: Current User  Vaping Use  . Vaping Use: Never used  Substance and Sexual Activity  . Alcohol use: No  . Drug use: No  . Sexual activity: Not on file  Other Topics Concern  . Not on file  Social History Narrative  . Not on file   Social Determinants of Health   Financial Resource Strain: Not on file  Food Insecurity: Not on  file  Transportation Needs: Not on file  Physical Activity: Not on file  Stress: Not on file  Social Connections: Not on file     Family History: The patient's family history is not on file.    ROS:   Please see the history of present illness.    All other systems reviewed and are negative.   EKGs/Labs/Other Studies Reviewed:    The following studies were reviewed today:  Echo 06/07/20 1. Left ventricular ejection fraction, by estimation, is 55 to 60%. The  left ventricle has normal function. The left ventricle has no regional  wall motion abnormalities. Left ventricular diastolic parameters are  indeterminate.   2. Right ventricular systolic function is normal. The right ventricular  size is normal.  3. Left atrial size was mildly dilated.  4. Right atrial size was mildly dilated.  5. The mitral valve is normal in structure. Mild mitral valve  regurgitation. No evidence of mitral stenosis.  6. Tricuspid valve regurgitation is mild to moderate.  7. The aortic valve was not well visualized. Aortic valve regurgitation  is mild. Mild to moderate aortic valve sclerosis/calcification is present,  without any evidence of aortic stenosis.  8. The inferior vena cava is normal in size with greater than 50%  respiratory variability, suggesting right atrial pressure of 3 mmHg.   EKG:  EKG is ordered today.  The ekg ordered today demonstrates atrial flutter at rate of 168 bpm  Recent Labs: 06/07/2020: B Natriuretic Peptide 170.0; TSH 2.081 06/23/2020: ALT 15; BUN 10; Creatinine, Ser 0.78; Hemoglobin 15.3; Magnesium 1.9; Platelets 206; Potassium 3.8; Sodium 134  Recent Lipid Panel    Component Value Date/Time   CHOL 150 06/07/2020 1434   TRIG 39 06/07/2020 1434   HDL 53 06/07/2020 1434   CHOLHDL 2.8 06/07/2020 1434   VLDL 8 06/07/2020 1434   LDLCALC 89 06/07/2020 1434     Risk Assessment/Calculations:    CHA2DS2-VASc Score = 2  This indicates a 2.2% annual risk of stroke. The patient's score is based upon: CHF History: No HTN History: No Diabetes History: No Stroke History: No Vascular Disease History: No Age Score: 1 Gender Score: 1       Physical Exam:    VS:  BP 132/88   Pulse (!) 168   Ht 5\' 5"  (1.651 m)   Wt 119 lb 6.4 oz (54.2 kg)   LMP  (LMP Unknown)   SpO2 97%   BMI 19.87 kg/m     Wt Readings from Last 3 Encounters:  08/16/20 119 lb 6.4 oz (54.2 kg)  07/17/20 121 lb 4.1 oz (55 kg)  06/28/20 121 lb 4 oz (55 kg)     GEN: Well nourished, well developed in no acute distress HEENT: Normal NECK: No JVD; No carotid bruits LYMPHATICS: No lymphadenopathy CARDIAC:  Regular tachycardic, no murmurs, rubs, gallops RESPIRATORY:  Clear to auscultation without rales, wheezing or rhonchi  ABDOMEN: Soft, non-tender, non-distended MUSCULOSKELETAL:  No edema; No deformity  SKIN: Warm and dry NEUROLOGIC:  Alert and oriented x 3 PSYCHIATRIC:  Normal affect   ASSESSMENT AND PLAN:    1. Persistent atrial fibrillation 2. Atrial flutter with rapid ventricular rate -Had a successful cardioversion July 17, 2020 but quickly returned to atrial fibrillation.  Yesterday heart rate was 80 however EKG today showed atrial flutter at rate of 168 bpm.  Reviewed with DOD Dr. July 19, 2020.  Start flecainide and as needed short acting Cardizem.  Follow-up in atrial fibrillation clinic next week. .I have  spent over 35 minutes, face-to-face with the patient and over 50% was spent in counseling and/or coordination of care. This includes review of prior records, care discussion with DOD (Dr. Johney Frame),  developing & discussing different plans, discussion of disease and its complications, life style changes.      Medication Adjustments/Labs and Tests Ordered: Current medicines are reviewed at length with the patient today.  Concerns regarding medicines are outlined above.  Orders Placed This Encounter  Procedures  . EKG 12-Lead   Meds ordered this encounter  Medications  . flecainide (TAMBOCOR) 100 MG tablet    Sig: Take 1 tablet (100 mg total) by mouth 2 (two) times daily.    Dispense:  60 tablet    Refill:  0  . diltiazem (CARDIZEM) 30 MG tablet    Sig: Take 1 tablet (30 mg total) by mouth every 8 (eight) hours as needed.    Dispense:  60 tablet    Refill:  0    Patient Instructions  Medication Instructions:  Your physician has recommended you make the following change in your medication:  1.  START Flecainide 100 mg taking 1 tablet twice a day  2.  START Cardizem 30 mg taking 1 tablet every 8 hours as needed for Palpitations or heart rate of 120 or higher  *If you need  a refill on your cardiac medications before your next appointment, please call your pharmacy*   Lab Work: None ordered  If you have labs (blood work) drawn today and your tests are completely normal, you will receive your results only by: Marland Kitchen MyChart Message (if you have MyChart) OR . A paper copy in the mail If you have any lab test that is abnormal or we need to change your treatment, we will call you to review the results.   Testing/Procedures: None ordered   Follow-Up: At [facility], you and your health needs are our priority.  As part of our continuing mission to provide you with exceptional heart care, we have created designated Provider Care Teams.  These Care Teams include your primary Cardiologist (physician) and Advanced Practice Providers (APPs -  Physician Assistants and Nurse Practitioners) who all work together to provide you with the care you need, when you need it.  We recommend signing up for the patient portal called "MyChart".  Sign up information is provided on this After Visit Summary.  MyChart is used to connect with patients for Virtual Visits (Telemedicine).  Patients are able to view lab/test results, encounter notes, upcoming appointments, etc.  Non-urgent messages can be sent to your provider as well.   To learn more about what you can do with MyChart, go to [email or web address].    Your next appointment:   Pt needs to be see ON Monday in the A-Fib Clinic  The format for your next appointment:   In Person  Provider:   You will follow up in the Atrial Fibrillation Clinic located at [facility]. Your provider will be: Rudi Coco, NP or Clint R. Charlean Merl, PA-C   Other Instructions      Signed, Manson Passey, [place]  08/16/2020 12:26 PM    [facility]

## 2020-08-16 NOTE — Patient Instructions (Addendum)
Medication Instructions:  Your physician has recommended you make the following change in your medication:  1.  START Flecainide 100 mg taking 1 tablet twice a day  2.  START Cardizem 30 mg taking 1 tablet every 8 hours as needed for Palpitations or heart rate of 120 or higher  *If you need a refill on your cardiac medications before your next appointment, please call your pharmacy*   Lab Work: None ordered  If you have labs (blood work) drawn today and your tests are completely normal, you will receive your results only by: Marland Kitchen MyChart Message (if you have MyChart) OR . A paper copy in the mail If you have any lab test that is abnormal or we need to change your treatment, we will call you to review the results.   Testing/Procedures: None ordered   Follow-Up: At [facility], you and your health needs are our priority.  As part of our continuing mission to provide you with exceptional heart care, we have created designated Provider Care Teams.  These Care Teams include your primary Cardiologist (physician) and Advanced Practice Providers (APPs -  Physician Assistants and Nurse Practitioners) who all work together to provide you with the care you need, when you need it.  We recommend signing up for the patient portal called "MyChart".  Sign up information is provided on this After Visit Summary.  MyChart is used to connect with patients for Virtual Visits (Telemedicine).  Patients are able to view lab/test results, encounter notes, upcoming appointments, etc.  Non-urgent messages can be sent to your provider as well.   To learn more about what you can do with MyChart, go to [email or web address].    Your next appointment:   Pt needs to be see ON Monday in the A-Fib Clinic  The format for your next appointment:   In Person  Provider:   You will follow up in the Atrial Fibrillation Clinic located at [facility]. Your provider will be: Rudi Coco, NP or Clint R. Fenton,  PA-C   Other Instructions

## 2020-08-17 ENCOUNTER — Telehealth: Payer: Self-pay | Admitting: Cardiology

## 2020-08-17 ENCOUNTER — Encounter (HOSPITAL_COMMUNITY): Payer: Self-pay | Admitting: *Deleted

## 2020-08-17 ENCOUNTER — Emergency Department (HOSPITAL_COMMUNITY)
Admission: EM | Admit: 2020-08-17 | Discharge: 2020-08-17 | Disposition: A | Discharge status: Home / Self Care | Payer: Medicare Other | Attending: Emergency Medicine | Admitting: Emergency Medicine

## 2020-08-17 ENCOUNTER — Other Ambulatory Visit: Payer: Self-pay

## 2020-08-17 DIAGNOSIS — Z79899 Other long term (current) drug therapy: Secondary | ICD-10-CM | POA: Diagnosis not present

## 2020-08-17 DIAGNOSIS — I4891 Unspecified atrial fibrillation: Secondary | ICD-10-CM | POA: Diagnosis not present

## 2020-08-17 DIAGNOSIS — Z7901 Long term (current) use of anticoagulants: Secondary | ICD-10-CM | POA: Insufficient documentation

## 2020-08-17 DIAGNOSIS — T7840XA Allergy, unspecified, initial encounter: Secondary | ICD-10-CM | POA: Diagnosis not present

## 2020-08-17 DIAGNOSIS — T462X5A Adverse effect of other antidysrhythmic drugs, initial encounter: Secondary | ICD-10-CM | POA: Diagnosis not present

## 2020-08-17 DIAGNOSIS — Z8616 Personal history of COVID-19: Secondary | ICD-10-CM | POA: Diagnosis not present

## 2020-08-17 DIAGNOSIS — F17210 Nicotine dependence, cigarettes, uncomplicated: Secondary | ICD-10-CM | POA: Diagnosis not present

## 2020-08-17 DIAGNOSIS — R208 Other disturbances of skin sensation: Secondary | ICD-10-CM | POA: Insufficient documentation

## 2020-08-17 DIAGNOSIS — I10 Essential (primary) hypertension: Secondary | ICD-10-CM | POA: Insufficient documentation

## 2020-08-17 DIAGNOSIS — K130 Diseases of lips: Secondary | ICD-10-CM | POA: Diagnosis not present

## 2020-08-17 DIAGNOSIS — E039 Hypothyroidism, unspecified: Secondary | ICD-10-CM | POA: Insufficient documentation

## 2020-08-17 DIAGNOSIS — T50905A Adverse effect of unspecified drugs, medicaments and biological substances, initial encounter: Secondary | ICD-10-CM

## 2020-08-17 NOTE — Telephone Encounter (Signed)
Patient calling to confirm that she has an appt on Monday 3/28. RN confirmed appt for 3/28 at 9:00 with Alphonzo Severance, PA. Patient states was seen in the ER last night for possible medication reaction to Flecanide that was started yesterday, and she was not sure if the appointment was made. RN reviewed discharge instructions from ER last night. Patient thanked Charity fundraiser for calling and verbalized understanding.

## 2020-08-17 NOTE — Telephone Encounter (Signed)
Addendum  Deanna Hamilton called back several hours later to report that she has swelling of her lips, globus sensation in her throat, and ongoing facial flushing.  She has no difficulty breathing, no lightheadedness or presyncope, and no nausea or vomiting.  I recommended that she come to the emergency department so she may be evaluated for allergic reaction.  She is in agreement.  Cynda Acres, MD

## 2020-08-17 NOTE — Discharge Instructions (Addendum)
Stop taking flecainide.  Follow-up with your cardiologist in the next few days to discuss your medications, and return to the ER if you develop throat swelling, difficulty breathing, or other new and concerning symptoms.

## 2020-08-17 NOTE — ED Triage Notes (Signed)
Pt started flecainide this morning for afib. Tonight reports her face feeling puffy and on fire and sob. Denies tongue swelling/tingling at present. Pt has spoken to cardiology tonight twice concerning symptoms

## 2020-08-17 NOTE — ED Provider Notes (Signed)
[facility] EMERGENCY DEPARTMENT Provider Note   CSN: [id number] Arrival date & time: 08/17/20  0143     History Chief Complaint  Patient presents with  . Medication Reaction    Deanna Hamilton is a 70 y.o. female.  Patient is a 70 year old female with history of atrial fibrillation, hypertension, hypothyroidism.  Patient presents today for evaluation of possible allergic reaction.  She was started on flecainide this morning by her primary care doctor.  She took 1 tablet at around noon, then shortly thereafter began with a burning sensation to her face and swelling of her left upper lip.  She denies any intraoral swelling or difficulty breathing/swallowing.  She denies any rash.  This seemed to get better, then worsened again in the evening.  She spoke with her cardiologist who advised her to come to the ER to be evaluated.  The history is provided by the patient.       Past Medical History:  Diagnosis Date  . A-fib (HCC)   . Anxiety   . Complication of anesthesia   . Hypertension   . Hypothyroidism   . PONV (postoperative nausea and vomiting)   . Thyroid disease     Patient Active Problem List   Diagnosis Date Noted  . Persistent atrial fibrillation (HCC) 06/07/2020  . Fall at home, initial encounter 06/07/2020  . Close exposure to COVID-19 virus 06/07/2020  . Palpitations 02/24/2017  . Acquired hypothyroidism 02/24/2017  . Coronary artery calcification 02/24/2017  . Tobacco abuse 02/24/2017  . Other emphysema (HCC) 02/24/2017    Past Surgical History:  Procedure Laterality Date  . ABDOMINAL HYSTERECTOMY    . CARDIOVERSION N/A 07/17/2020   Procedure: CARDIOVERSION;  Surgeon: Christell Constant, MD;  Location: [facility];  Service: Cardiovascular;  Laterality: N/A;     OB History   No obstetric history on file.     No family history on file.  Social History   Tobacco Use  . Smoking status: Current Every Day Smoker    Packs/day:  0.50    Types: Cigarettes  . Smokeless tobacco: Current User  Vaping Use  . Vaping Use: Never used  Substance Use Topics  . Alcohol use: No  . Drug use: No    Home Medications Prior to Admission medications   Medication Sig Start Date End Date Taking? Authorizing Provider  apixaban (ELIQUIS) 5 MG TABS tablet Take 1 tablet (5 mg total) by mouth 2 (two) times daily. 06/28/20   Jake Bathe, MD  diltiazem (CARDIZEM CD) 120 MG 24 hr capsule Take 1 capsule (120 mg total) by mouth 2 (two) times daily. 06/28/20   Jake Bathe, MD  diltiazem (CARDIZEM) 30 MG tablet Take 1 tablet (30 mg total) by mouth every 8 (eight) hours as needed. 08/16/20   Bhagat, Sharrell Ku, PA  famotidine (PEPCID) 10 MG tablet Take 10 mg by mouth 2 (two) times daily as needed for heartburn or indigestion.    [provider]  flecainide (TAMBOCOR) 100 MG tablet Take 1 tablet (100 mg total) by mouth 2 (two) times daily. 08/16/20   Bhagat, Sharrell Ku, PA  ibuprofen (ADVIL) 200 MG tablet Take 200 mg by mouth every 8 (eight) hours as needed (PAIN).    [provider]  levothyroxine (SYNTHROID) 88 MCG tablet Take 88 mcg by mouth daily before breakfast. 07/11/17   [provider]  metoprolol tartrate (LOPRESSOR) 25 MG tablet TAKE ONE TABLET BY MOUTH TWICE A DAY Patient taking differently: Take 25  mg by mouth 2 (two) times daily. 02/10/18 06/07/20  Jake Bathe, MD    Allergies    Erythromycin, Prednisone, and Xanax [alprazolam]  Review of Systems   Review of Systems  All other systems reviewed and are negative.   Physical Exam Updated Vital Signs BP (!) 138/116 (BP Location: Right Arm)   Pulse (!) 115   Temp 98.2 F (36.8 C) (Oral)   Resp 20   LMP  (LMP Unknown)   SpO2 94%   Physical Exam Vitals and nursing note reviewed.  Constitutional:      General: She is not in acute distress.    Appearance: She is well-developed. She is not diaphoretic.  HENT:     Head: Normocephalic and  atraumatic.     Mouth/Throat:     Mouth: Mucous membranes are moist.     Comments: There is mild swelling of the left upper lip.  There is no intraoral swelling.  There is no stridor. Cardiovascular:     Rate and Rhythm: Normal rate and regular rhythm.     Heart sounds: No murmur heard. No friction rub. No gallop.   Pulmonary:     Effort: Pulmonary effort is normal. No respiratory distress.     Breath sounds: Normal breath sounds. No wheezing.  Abdominal:     General: Bowel sounds are normal. There is no distension.     Palpations: Abdomen is soft.     Tenderness: There is no abdominal tenderness.  Musculoskeletal:        General: Normal range of motion.     Cervical back: Normal range of motion and neck supple.  Skin:    General: Skin is warm and dry.  Neurological:     Mental Status: She is alert and oriented to person, place, and time.     ED Results / Procedures / Treatments   Labs (all labs ordered are listed, but only abnormal results are displayed) Labs Reviewed - No data to display  EKG None  Radiology No results found.  Procedures Procedures   Medications Ordered in ED Medications - No data to display  ED Course  I have reviewed the triage vital signs and the nursing notes.  Pertinent labs & imaging results that were available during my care of the patient were reviewed by me and considered in my medical decision making (see chart for details).    MDM Rules/Calculators/A&P  Patient presenting with lip swelling and facial burning after taking her initial dose of flecainide this afternoon.  Patient has no rash, stridor, or evidence for an allergic reaction.  I feel as though this is a side effect that is not in need of any specific treatment.  Patient has been observed in the ER for 2 hours.  Her symptoms have resolved and she feels fine.  Vitals are stable with no hypoxia.  Patient advised to stop taking the flecainide and follow-up with her cardiologist in  the near future.  Final Clinical Impression(s) / ED Diagnoses Final diagnoses:  None    Rx / DC Orders ED Discharge Orders    None       Geoffery Lyons, MD 08/17/20 0330

## 2020-08-17 NOTE — Telephone Encounter (Signed)
I received a phone call from Ms. Seefeld this evening.  She has persistent atrial fibrillation and hypertension.  She underwent cardioversion a few months ago with early recurrence of atrial fibrillation.  She is on long-acting diltiazem.  At her office visit today, flecainide was added and she was given a prescription for short acting diltiazem in case of symptomatic RVR.  She reports that this evening she began having tachypalpitations and dyspnea, which are typical of her atrial fibrillation symptoms.  She also had a feeling of facial flushing which is not typical of her atrial fibrillation symptoms.  She has no rash, diaphoresis, sensation of throat swelling, chest discomfort, lightheadedness, or presyncope.  Her heart rates was in the 140s and her blood pressure is 140/90.  She took her long-acting diltiazem approximately 1 hour ago.  I recommended that she take a dose of the short acting diltiazem 30 mg.  I reviewed red flag symptoms that should trigger a visit to the emergency department.  She is concerned about remaining on flecainide and will call her doctor in the morning to discuss an alternative antiarrhythmic drug.  Cynda Acres, MD

## 2020-08-17 NOTE — Telephone Encounter (Signed)
    Pt was in the hospital recently and requesting to speak with Dr. Minerva Fester nurse

## 2020-08-21 ENCOUNTER — Ambulatory Visit (HOSPITAL_COMMUNITY)
Admission: RE | Admit: 2020-08-21 | Discharge: 2020-08-21 | Disposition: A | Discharge status: Home / Self Care | Payer: Medicare Other | Source: Ambulatory Visit | Attending: Physician Assistant | Admitting: Physician Assistant

## 2020-08-21 ENCOUNTER — Other Ambulatory Visit: Payer: Self-pay

## 2020-08-21 ENCOUNTER — Encounter (HOSPITAL_COMMUNITY): Payer: Self-pay | Admitting: Physician Assistant

## 2020-08-21 VITALS — BP 128/84 | HR 97 | Wt 118.2 lb

## 2020-08-21 DIAGNOSIS — F17210 Nicotine dependence, cigarettes, uncomplicated: Secondary | ICD-10-CM | POA: Diagnosis not present

## 2020-08-21 DIAGNOSIS — I4819 Other persistent atrial fibrillation: Secondary | ICD-10-CM | POA: Diagnosis not present

## 2020-08-21 DIAGNOSIS — Z79890 Hormone replacement therapy (postmenopausal): Secondary | ICD-10-CM | POA: Insufficient documentation

## 2020-08-21 DIAGNOSIS — Z79899 Other long term (current) drug therapy: Secondary | ICD-10-CM | POA: Insufficient documentation

## 2020-08-21 DIAGNOSIS — I480 Paroxysmal atrial fibrillation: Secondary | ICD-10-CM | POA: Diagnosis not present

## 2020-08-21 DIAGNOSIS — Z7901 Long term (current) use of anticoagulants: Secondary | ICD-10-CM | POA: Diagnosis not present

## 2020-08-21 DIAGNOSIS — E039 Hypothyroidism, unspecified: Secondary | ICD-10-CM | POA: Diagnosis not present

## 2020-08-21 DIAGNOSIS — I10 Essential (primary) hypertension: Secondary | ICD-10-CM | POA: Diagnosis not present

## 2020-08-21 DIAGNOSIS — Z8616 Personal history of COVID-19: Secondary | ICD-10-CM | POA: Diagnosis not present

## 2020-08-21 NOTE — Addendum Note (Signed)
Encounter addended by: Shona Simpson, RN on: 08/21/2020 11:31 AM  Actions taken: Visit diagnoses modified, Order list changed, Diagnosis association updated

## 2020-08-21 NOTE — Progress Notes (Signed)
Primary Care Physician: Shirlean Mylar, MD Primary Cardiologist: Dr Anne Fu Primary Electrophysiologist: none Referring Physician: Manson Passey PA   Deanna Hamilton is a 70 y.o. female with a history of HTN, hypothyroidism, atrial flutter, and atrial fibrillation who presents for consultation in the [facility].  The patient was initially diagnosed with atrial fibrillation 05/2020 after presenting to the ED with symptoms of lightheadedness and weakness. ECG showed afib with RVR. Patient is on Eliquis for a CHADS2VASC score of 2. She underwent DCCV on 07/17/20 but unfortunately quickly reverted back to afib. She was started flecainide but developed symptoms of burning on her face and upper lip, presented to the ED. Flecainide was discontinued. She has not had interval heart racing and feels well today. Patient does report she was diagnosed with COVID around the time of her afib diagnosis. She denies significant snoring or alcohol use.   Today, she denies symptoms of palpitations, chest pain, shortness of breath, orthopnea, PND, lower extremity edema, dizziness, presyncope, syncope, snoring, daytime somnolence, bleeding, or neurologic sequela. The patient is tolerating medications without difficulties and is otherwise without complaint today.    Atrial Fibrillation Risk Factors:  she does not have symptoms or diagnosis of sleep apnea. she does not have a history of rheumatic fever. she does not have a history of alcohol use. The patient does not have a history of early familial atrial fibrillation or other arrhythmias.  she has a BMI of Body mass index is 19.67 kg/m.Marland Kitchen Filed Weights   08/21/20 0908  Weight: 53.6 kg    No family history on file.   Atrial Fibrillation Management history:  Previous antiarrhythmic drugs: flecainide  Previous cardioversions: 07/17/20 Previous ablations: none CHADS2VASC score: 2 Anticoagulation history: Eliquis   Past  Medical History:  Diagnosis Date  . A-fib (HCC)   . Anxiety   . Complication of anesthesia   . Hypertension   . Hypothyroidism   . PONV (postoperative nausea and vomiting)   . Thyroid disease    Past Surgical History:  Procedure Laterality Date  . ABDOMINAL HYSTERECTOMY    . CARDIOVERSION N/A 07/17/2020   Procedure: CARDIOVERSION;  Surgeon: Christell Constant, MD;  Location: [facility];  Service: Cardiovascular;  Laterality: N/A;    Current Outpatient Medications  Medication Sig Dispense Refill  . apixaban (ELIQUIS) 5 MG TABS tablet Take 1 tablet (5 mg total) by mouth 2 (two) times daily. 60 tablet 11  . diltiazem (CARDIZEM CD) 120 MG 24 hr capsule Take 1 capsule (120 mg total) by mouth 2 (two) times daily. 180 capsule 3  . ibuprofen (ADVIL) 200 MG tablet Take 200 mg by mouth every 8 (eight) hours as needed (PAIN).    Marland Kitchen levothyroxine (SYNTHROID) 88 MCG tablet Take 88 mcg by mouth daily before breakfast.    . diltiazem (CARDIZEM) 30 MG tablet Take 1 tablet (30 mg total) by mouth every 8 (eight) hours as needed. (Patient not taking: Reported on 08/21/2020) 60 tablet 0  . famotidine (PEPCID) 10 MG tablet Take 10 mg by mouth 2 (two) times daily as needed for heartburn or indigestion. (Patient not taking: Reported on 08/21/2020)     No current facility-administered medications for this encounter.    Allergies  Allergen Reactions  . Erythromycin Nausea Only  . Prednisone Other (See Comments)    Skin Crawling  . Xanax [Alprazolam] Other (See Comments)    Skin Crawling    Social History   Socioeconomic History  . Marital status:  Widowed    Spouse name: Not on file  . Number of children: Not on file  . Years of education: Not on file  . Highest education level: Not on file  Occupational History  . Not on file  Tobacco Use  . Smoking status: Current Every Day Smoker    Packs/day: 0.50    Types: Cigarettes  . Smokeless tobacco: Current User  Vaping Use  . Vaping Use:  Never used  Substance and Sexual Activity  . Alcohol use: No  . Drug use: No  . Sexual activity: Not on file  Other Topics Concern  . Not on file  Social History Narrative  . Not on file   Social Determinants of Health   Financial Resource Strain: Not on file  Food Insecurity: Not on file  Transportation Needs: Not on file  Physical Activity: Not on file  Stress: Not on file  Social Connections: Not on file  Intimate Partner Violence: Not on file     ROS- All systems are reviewed and negative except as per the HPI above.  Physical Exam: Vitals:   08/21/20 0908  BP: 128/84  Pulse: 97  SpO2: 97%  Weight: 53.6 kg    GEN- The patient is a well appearing female, alert and oriented x 3 today.   Head- normocephalic, atraumatic Eyes-  Sclera clear, conjunctiva pink Ears- hearing intact Oropharynx- clear Neck- supple  Lungs- Clear to ausculation bilaterally, normal work of breathing Heart- Regular rate and rhythm, no murmurs, rubs or gallops  GI- soft, NT, ND, + BS Extremities- no clubbing, cyanosis, or edema MS- no significant deformity or atrophy Skin- no rash or lesion Psych- euthymic mood, full affect Neuro- strength and sensation are intact  Wt Readings from Last 3 Encounters:  08/21/20 53.6 kg  08/16/20 54.2 kg  07/17/20 55 kg    EKG today demonstrates  SR Vent. rate 96 BPM PR interval 156 ms QRS duration 70 ms QT/QTc 328/414 ms  Echo 06/07/20 demonstrated  1. Left ventricular ejection fraction, by estimation, is 55 to 60%. The  left ventricle has normal function. The left ventricle has no regional  wall motion abnormalities. Left ventricular diastolic parameters are  indeterminate.  2. Right ventricular systolic function is normal. The right ventricular  size is normal.  3. Left atrial size was mildly dilated.  4. Right atrial size was mildly dilated.  5. The mitral valve is normal in structure. Mild mitral valve  regurgitation. No evidence of  mitral stenosis.  6. Tricuspid valve regurgitation is mild to moderate.  7. The aortic valve was not well visualized. Aortic valve regurgitation  is mild. Mild to moderate aortic valve sclerosis/calcification is present,  without any evidence of aortic stenosis.  8. The inferior vena cava is normal in size with greater than 50%  respiratory variability, suggesting right atrial pressure of 3 mmHg.   Epic records are reviewed at length today  CHA2DS2-VASc Score = 2  The patient's score is based upon: CHF History: No HTN History: No Diabetes History: No Stroke History: No Vascular Disease History: No Age Score: 1 Gender Score: 1      ASSESSMENT AND PLAN: 1. Persistent Atrial Fibrillation (ICD10:  I48.19) The patient's CHA2DS2-VASc score is 2, indicating a 2.2% annual risk of stroke.   S/p DCCV 07/17/20 with early return of afib. Intolerant of flecainide. We discussed therapeutic options today including alternate AAD (propafenone) vs ablation. Patient is anxious about starting new medications given her reaction to flecainide. She  is interested in discussing ablation. Will refer to EP. Continue diltiazem 120 mg BID with 30 mg PRN for heart racing. Continue Eliquis 5 mg BID   Follow up with EP for ablation evaluation.    Jorja Loa PA-C [facility] [address] [place], [place] [address] [phone] 08/21/2020 10:17 AM

## 2020-09-01 ENCOUNTER — Other Ambulatory Visit: Payer: Self-pay | Admitting: Physician Assistant

## 2020-09-04 ENCOUNTER — Other Ambulatory Visit: Payer: Self-pay

## 2020-09-04 ENCOUNTER — Ambulatory Visit: Payer: Medicare Other | Admitting: Internal Medicine

## 2020-09-04 ENCOUNTER — Encounter: Payer: Self-pay | Admitting: *Deleted

## 2020-09-04 ENCOUNTER — Encounter: Payer: Self-pay | Admitting: Internal Medicine

## 2020-09-04 VITALS — BP 126/80 | HR 130 | Ht 65.0 in | Wt 118.0 lb

## 2020-09-04 DIAGNOSIS — I4891 Unspecified atrial fibrillation: Secondary | ICD-10-CM

## 2020-09-04 DIAGNOSIS — I4819 Other persistent atrial fibrillation: Secondary | ICD-10-CM

## 2020-09-04 MED ORDER — METOPROLOL TARTRATE 25 MG PO TABS: 25.0000 mg | ORAL_TABLET | Freq: Once | ORAL | 0 refills | Status: DC | PRN

## 2020-09-04 NOTE — Patient Instructions (Signed)
Medication Instructions:  Your physician recommends that you continue on your current medications as directed. Please refer to the Current Medication list given to you today.  Labwork: None ordered.  Testing/Procedures: Your physician has requested that you have cardiac CT. Cardiac computed tomography (CT) is a painless test that uses an x-ray machine to take clear, detailed pictures of your heart. For further information please visit www.cardiosmart.org. Please follow instruction sheet as given.  Your physician has recommended that you have an ablation. Catheter ablation is a medical procedure used to treat some cardiac arrhythmias (irregular heartbeats). During catheter ablation, a long, thin, flexible tube is put into a blood vessel in your groin (upper thigh), or neck. This tube is called an ablation catheter. It is then guided to your heart through the blood vessel. Radio frequency waves destroy small areas of heart tissue where abnormal heartbeats may cause an arrhythmia to start. Please see the instruction sheet given to you today.    Any Other Special Instructions Will Be Listed Below (If Applicable).  If you need a refill on your cardiac medications before your next appointment, please call your pharmacy.   . Cardiac Ablation Cardiac ablation is a procedure to destroy (ablate) some heart tissue that is sending bad signals. These bad signals cause problems in heart rhythm. The heart has many areas that make these signals. If there are problems in these areas, they can make the heart beat in a way that is not normal. Destroying some tissues can help make the heart rhythm normal. Tell your doctor about:  Any allergies you have.  All medicines you are taking. These include vitamins, herbs, eye drops, creams, and over-the-counter medicines.  Any problems you or family members have had with medicines that make you fall asleep (anesthetics).  Any blood disorders you have.  Any  surgeries you have had.  Any medical conditions you have, such as kidney failure.  Whether you are pregnant or may be pregnant. What are the risks? This is a safe procedure. But problems may occur, including:  Infection.  Bruising and bleeding.  Bleeding into the chest.  Stroke or blood clots.  Damage to nearby areas of your body.  Allergies to medicines or dyes.  The need for a pacemaker if the normal system is damaged.  Failure of the procedure to treat the problem. What happens before the procedure? Medicines Ask your doctor about:  Changing or stopping your normal medicines. This is important.  Taking aspirin and ibuprofen. Do not take these medicines unless your doctor tells you to take them.  Taking other medicines, vitamins, herbs, and supplements. General instructions  Follow instructions from your doctor about what you cannot eat or drink.  Plan to have someone take you home from the hospital or clinic.  If you will be going home right after the procedure, plan to have someone with you for 24 hours.  Ask your doctor what steps will be taken to prevent infection. What happens during the procedure?  An IV tube will be put into one of your veins.  You will be given a medicine to help you relax.  The skin on your neck or groin will be numbed.  A cut (incision) will be made in your neck or groin. A needle will be put through your cut and into a large vein.  A tube (catheter) will be put into the needle. The tube will be moved to your heart.  Dye may be put through the tube. This helps   your doctor see your heart.  Small devices (electrodes) on the tube will send out signals.  A type of energy will be used to destroy some heart tissue.  The tube will be taken out.  Pressure will be held on your cut. This helps stop bleeding.  A bandage will be put over your cut. The exact procedure may vary among doctors and hospitals.   What happens after the  procedure?  You will be watched until you leave the hospital or clinic. This includes checking your heart rate, breathing rate, oxygen, and blood pressure.  Your cut will be watched for bleeding. You will need to lie still for a few hours.  Do not drive for 24 hours or as long as your doctor tells you. Summary  Cardiac ablation is a procedure to destroy some heart tissue. This is done to treat heart rhythm problems.  Tell your doctor about any medical conditions you may have. Tell him or her about all medicines you are taking to treat them.  This is a safe procedure. But problems may occur. These include infection, bruising, bleeding, and damage to nearby areas of your body.  Follow what your doctor tells you about food and drink. You may also be told to change or stop some of your medicines.  After the procedure, do not drive for 24 hours or as long as your doctor tells you. This information is not intended to replace advice given to you by your health care provider. Make sure you discuss any questions you have with your health care provider. Document Revised: 04/15/2019 Document Reviewed: 04/15/2019 Elsevier Patient Education  2021 Elsevier Inc.        

## 2020-09-04 NOTE — Progress Notes (Signed)
Electrophysiology Office Note   Date:  09/04/2020   ID:  Deanna Hamilton, DOB 31-Oct-1950, MRN [id number]  PCP:  Shirlean Mylar, MD  Cardiologist:  Dr Anne Fu Primary Electrophysiologist: Hillis Range, MD    CC: afib   History of Present Illness: Deanna Hamilton is a 70 y.o. female who presents today for electrophysiology evaluation.   The patient is referred by Dr Anne Fu and the AF clinic for Ep consultation regarding afib. The patient was initially diagnosed with afib 05/2020 after presenting to the ED with weakness and fatigue. She was cardioverted but quickly returned to AF.  She has been placed on flecainide but did not tolerate this medicine due to side effects.   She has frequent tachypalpitations.  She appears to have intermittent but daily afib.  Today, she denies symptoms of chest pain, shortness of breath, dizziness, presyncope, syncope,  or neurologic sequela. The patient is tolerating medications without difficulties and is otherwise without complaint today.    Past Medical History:  Diagnosis Date  . A-fib (HCC)   . Anxiety   . Complication of anesthesia   . Hypertension   . Hypothyroidism   . PONV (postoperative nausea and vomiting)   . Thyroid disease    Past Surgical History:  Procedure Laterality Date  . ABDOMINAL HYSTERECTOMY    . CARDIOVERSION N/A 07/17/2020   Procedure: CARDIOVERSION;  Surgeon: Christell Constant, MD;  Location: [facility];  Service: Cardiovascular;  Laterality: N/A;     Current Outpatient Medications  Medication Sig Dispense Refill  . apixaban (ELIQUIS) 5 MG TABS tablet Take 1 tablet (5 mg total) by mouth 2 (two) times daily. 60 tablet 11  . diltiazem (CARDIZEM CD) 120 MG 24 hr capsule Take 1 capsule (120 mg total) by mouth 2 (two) times daily. 180 capsule 3  . diltiazem (CARDIZEM) 30 MG tablet TAKE 1 TABLET BY MOUTH EVERY 8 HOURS AS NEEDED 60 tablet 2  . famotidine (PEPCID) 10 MG tablet Take 10 mg by mouth 2 (two) times daily as  needed for heartburn or indigestion. (Patient not taking: Reported on 08/21/2020)    . ibuprofen (ADVIL) 200 MG tablet Take 200 mg by mouth every 8 (eight) hours as needed (PAIN).    Marland Kitchen levothyroxine (SYNTHROID) 88 MCG tablet Take 88 mcg by mouth daily before breakfast.     No current facility-administered medications for this visit.    Allergies:   Erythromycin, Prednisone, and Xanax [alprazolam]   Social History:  The patient  reports that she has been smoking cigarettes. She has been smoking about 0.50 packs per day. She uses smokeless tobacco. She reports that she does not drink alcohol and does not use drugs.   Family History:  + CAD,  Denies AF   ROS:  Please see the history of present illness.   All other systems are personally reviewed and negative.    PHYSICAL EXAM: VS:  LMP  (LMP Unknown)  , BMI There is no height or weight on file to calculate BMI. GEN: Well nourished, well developed, in no acute distress HEENT: normal Neck: no JVD, carotid bruits, or masses Cardiac: tachycardic irregular Respiratory:  clear to auscultation bilaterally, normal work of breathing GI: soft  MS: no deformity or atrophy Skin: warm and dry  Neuro:  Strength and sensation are intact Psych: euthymic mood, full affect  EKG:  EKG is ordered today. The ekg ordered today is personally reviewed and shows atach (likely PV trigger) with conversion to sinus  Recent Labs: 06/07/2020: B Natriuretic Peptide 170.0; TSH 2.081 06/23/2020: ALT 15; BUN 10; Creatinine, Ser 0.78; Hemoglobin 15.3; Magnesium 1.9; Platelets 206; Potassium 3.8; Sodium 134  personally reviewed   Lipid Panel     Component Value Date/Time   CHOL 150 06/07/2020 1434   TRIG 39 06/07/2020 1434   HDL 53 06/07/2020 1434   CHOLHDL 2.8 06/07/2020 1434   VLDL 8 06/07/2020 1434   LDLCALC 89 06/07/2020 1434   personally reviewed   Wt Readings from Last 3 Encounters:  08/21/20 118 lb 3.2 oz (53.6 kg)  08/16/20 119 lb 6.4 oz (54.2  kg)  07/17/20 121 lb 4.1 oz (55 kg)      Other studies personally reviewed: Additional studies/ records that were reviewed today include: AF clinic notes, prior echo  Review of the above records today demonstrates: as above   ASSESSMENT AND PLAN:  1.  Persistent atrial fibrillation The patient has symptomatic, recurrent  atrial fibrillation.  Today, she is intermittently in sinus but frequently in rapid afib vs atach. she has failed medical therapy with flecainide. Chads2vasc score is 2.  she is anticoagulated with eliquis . Therapeutic strategies for afib including medicine (rhythmol,multaq, tikosyn, amiodarone) and ablation were discussed in detail with the patient today. Risk, benefits, and alternatives to EP study and radiofrequency ablation for afib were also discussed in detail today. These risks include but are not limited to stroke, bleeding, vascular damage, tamponade, perforation, damage to the esophagus, lungs, and other structures, pulmonary vein stenosis, worsening renal function, and death. The patient understands these risk and wishes to proceed.  We will therefore proceed with catheter ablation at the next available time.  Carto, ICE, anesthesia are requested for the procedure.  Will also obtain cardiac CT prior to the procedure to exclude LAA thrombus and further evaluate atrial anatomy.   Risks, benefits and potential toxicities for medications prescribed and/or refilled reviewed with patient today.     Randolm Idol, MD  09/04/2020 10:06 AM     [facility] [address] [place] [address] [phone] (office) [phone] (fax)

## 2020-09-11 ENCOUNTER — Other Ambulatory Visit: Payer: Self-pay | Admitting: Physician Assistant

## 2020-09-29 ENCOUNTER — Other Ambulatory Visit: Payer: Medicare Other | Admitting: *Deleted

## 2020-09-29 ENCOUNTER — Other Ambulatory Visit: Payer: Self-pay

## 2020-09-29 DIAGNOSIS — I4891 Unspecified atrial fibrillation: Secondary | ICD-10-CM

## 2020-09-29 DIAGNOSIS — I4819 Other persistent atrial fibrillation: Secondary | ICD-10-CM

## 2020-09-29 LAB — CBC WITH DIFFERENTIAL/PLATELET
Basophils Absolute: 0.1 10*3/uL (ref 0.0–0.2)
Basos: 1 %
EOS (ABSOLUTE): 0.1 10*3/uL (ref 0.0–0.4)
Eos: 3 %
Hematocrit: 43.8 % (ref 34.0–46.6)
Hemoglobin: 14.4 g/dL (ref 11.1–15.9)
Immature Grans (Abs): 0 10*3/uL (ref 0.0–0.1)
Immature Granulocytes: 0 %
Lymphocytes Absolute: 1.9 10*3/uL (ref 0.7–3.1)
Lymphs: 37 %
MCH: 30.1 pg (ref 26.6–33.0)
MCHC: 32.9 g/dL (ref 31.5–35.7)
MCV: 91 fL (ref 79–97)
Monocytes Absolute: 0.5 10*3/uL (ref 0.1–0.9)
Monocytes: 10 %
Neutrophils Absolute: 2.5 10*3/uL (ref 1.4–7.0)
Neutrophils: 49 %
Platelets: 213 10*3/uL (ref 150–450)
RBC: 4.79 x10E6/uL (ref 3.77–5.28)
RDW: 12.8 % (ref 11.7–15.4)
WBC: 5.1 10*3/uL (ref 3.4–10.8)

## 2020-09-29 LAB — BASIC METABOLIC PANEL
BUN/Creatinine Ratio: 18 (ref 12–28)
BUN: 13 mg/dL (ref 8–27)
CO2: 24 mmol/L (ref 20–29)
Calcium: 9.3 mg/dL (ref 8.7–10.3)
Chloride: 97 mmol/L (ref 96–106)
Creatinine, Ser: 0.72 mg/dL (ref 0.57–1.00)
Glucose: 97 mg/dL (ref 65–99)
Potassium: 4.3 mmol/L (ref 3.5–5.2)
Sodium: 136 mmol/L (ref 134–144)
eGFR: 89 mL/min/{1.73_m2} (ref >59)

## 2020-10-09 ENCOUNTER — Telehealth: Payer: Self-pay

## 2020-10-09 NOTE — Telephone Encounter (Signed)
Left message for the pt to call back:  Cardiac CT cancelled and pt to have TEE morning of her Afib Ablation on 10/20/20 arrival at 7:30 am.

## 2020-10-10 NOTE — Telephone Encounter (Signed)
Patient is returning call.  °

## 2020-10-10 NOTE — Telephone Encounter (Signed)
Left another message for the pt to call back on her mobile number since the other number listed [place] out of service.   [phone] (Mobile)  Covid test for 5/25 also cancelled.

## 2020-10-10 NOTE — Telephone Encounter (Signed)
Pt verbalized understanding her instructions for her TEE being done the same morning as her Ablation. Her new arrival time is 7:30 am.

## 2020-10-10 NOTE — Telephone Encounter (Signed)
Left another message for the pt to call me back.

## 2020-10-10 NOTE — Telephone Encounter (Signed)
Follow Up:      Pt is returning Ann's call from yesterday

## 2020-10-12 ENCOUNTER — Ambulatory Visit (HOSPITAL_COMMUNITY): Payer: Medicare Other

## 2020-10-13 DIAGNOSIS — J018 Other acute sinusitis: Secondary | ICD-10-CM | POA: Diagnosis not present

## 2020-10-13 DIAGNOSIS — J069 Acute upper respiratory infection, unspecified: Secondary | ICD-10-CM | POA: Diagnosis not present

## 2020-10-13 DIAGNOSIS — Z03818 Encounter for observation for suspected exposure to other biological agents ruled out: Secondary | ICD-10-CM | POA: Diagnosis not present

## 2020-10-13 DIAGNOSIS — R059 Cough, unspecified: Secondary | ICD-10-CM | POA: Diagnosis not present

## 2020-10-18 ENCOUNTER — Other Ambulatory Visit (HOSPITAL_COMMUNITY): Payer: Medicare Other

## 2020-10-18 ENCOUNTER — Telehealth: Payer: Self-pay | Admitting: Cardiology

## 2020-10-18 NOTE — Telephone Encounter (Signed)
Patient is calling to say that she is supposed to have a procedure done on Friday the 27th but patient has bronchitis. Please advise

## 2020-10-18 NOTE — Telephone Encounter (Signed)
Rescheduled ablation to July 7. TEE July 6     You are scheduled for a TEE on July 6  with Dr. Bjorn Pippin.  Please arrive at the [facility] (Main Entrance A) at [facility]: [address] [place], [place] [address] at 9 am.  DIET: Nothing to eat or drink after midnight except a sip of water with medications (see medication instructions below)  Medication Instructions: Okay to take all AM medications   Continue your anticoagulant: Eliquis You will need to continue your anticoagulant after your procedure until you  are told by your  Provider that it is safe to stop  You must have a responsible person to drive you home and stay in the waiting area during your procedure. Failure to do so could result in cancellation.  Patient has been given all instructions over the phone. Verbalized understanding.

## 2020-10-20 ENCOUNTER — Encounter (HOSPITAL_COMMUNITY)
Admission: RE | Disposition: A | Discharge status: Home / Self Care | Payer: Self-pay | Source: Home / Self Care | Attending: Internal Medicine

## 2020-10-20 SURGERY — ECHOCARDIOGRAM, TRANSESOPHAGEAL
Anesthesia: Monitor Anesthesia Care

## 2020-11-17 ENCOUNTER — Ambulatory Visit (HOSPITAL_COMMUNITY): Payer: Medicare Other | Admitting: Physician Assistant

## 2020-11-28 ENCOUNTER — Telehealth: Payer: Self-pay | Admitting: Cardiology

## 2020-11-28 NOTE — Telephone Encounter (Signed)
Patient states she is returning a call regarding her TEE, scheduled for 11/30/20. Possibly to go over instructions? She states the call was from an [place], but I don't see any current notes. Is anyone able to advise?

## 2020-11-28 NOTE — Telephone Encounter (Signed)
Returned call to patient who states she had a message from our office this morning. I reviewed her chart and advised I do not see any recent calls. I reviewed pre-procedure instructions for TEE on 7/6 and A Fib ablation on 7/7. Questions were answered to her satisfaction and she thanked me for the call.

## 2020-11-29 ENCOUNTER — Ambulatory Visit (HOSPITAL_BASED_OUTPATIENT_CLINIC_OR_DEPARTMENT_OTHER)
Admission: RE | Admit: 2020-11-29 | Discharge: 2020-11-29 | Disposition: A | Discharge status: Home / Self Care | Payer: Medicare Other | Source: Ambulatory Visit | Attending: Cardiology | Admitting: Cardiology

## 2020-11-29 ENCOUNTER — Ambulatory Visit (HOSPITAL_COMMUNITY): Payer: Medicare Other

## 2020-11-29 ENCOUNTER — Encounter (HOSPITAL_COMMUNITY)
Admission: RE | Disposition: A | Discharge status: Home / Self Care | Payer: Self-pay | Source: Home / Self Care | Attending: Cardiology

## 2020-11-29 ENCOUNTER — Encounter (HOSPITAL_COMMUNITY): Payer: Self-pay | Admitting: Cardiology

## 2020-11-29 ENCOUNTER — Ambulatory Visit (HOSPITAL_COMMUNITY)
Admission: RE | Admit: 2020-11-29 | Discharge: 2020-11-29 | Disposition: A | Discharge status: Home / Self Care | Payer: Medicare Other | Attending: Cardiology | Admitting: Cardiology

## 2020-11-29 DIAGNOSIS — F17200 Nicotine dependence, unspecified, uncomplicated: Secondary | ICD-10-CM | POA: Insufficient documentation

## 2020-11-29 DIAGNOSIS — E079 Disorder of thyroid, unspecified: Secondary | ICD-10-CM | POA: Diagnosis not present

## 2020-11-29 DIAGNOSIS — I10 Essential (primary) hypertension: Secondary | ICD-10-CM | POA: Diagnosis not present

## 2020-11-29 DIAGNOSIS — E039 Hypothyroidism, unspecified: Secondary | ICD-10-CM | POA: Diagnosis not present

## 2020-11-29 DIAGNOSIS — I088 Other rheumatic multiple valve diseases: Secondary | ICD-10-CM | POA: Diagnosis not present

## 2020-11-29 DIAGNOSIS — I351 Nonrheumatic aortic (valve) insufficiency: Secondary | ICD-10-CM | POA: Diagnosis not present

## 2020-11-29 DIAGNOSIS — I480 Paroxysmal atrial fibrillation: Secondary | ICD-10-CM

## 2020-11-29 DIAGNOSIS — I4891 Unspecified atrial fibrillation: Secondary | ICD-10-CM | POA: Insufficient documentation

## 2020-11-29 DIAGNOSIS — Q211 Atrial septal defect: Secondary | ICD-10-CM | POA: Diagnosis not present

## 2020-11-29 DIAGNOSIS — I4819 Other persistent atrial fibrillation: Secondary | ICD-10-CM | POA: Diagnosis not present

## 2020-11-29 DIAGNOSIS — Z7901 Long term (current) use of anticoagulants: Secondary | ICD-10-CM | POA: Insufficient documentation

## 2020-11-29 HISTORY — PX: BUBBLE STUDY: SHX6837

## 2020-11-29 HISTORY — PX: TEE WITHOUT CARDIOVERSION: SHX5443

## 2020-11-29 LAB — POCT I-STAT, CHEM 8
BUN: 9 mg/dL (ref 8–23)
Calcium, Ion: 1.13 mmol/L — ABNORMAL LOW (ref 1.15–1.40)
Chloride: 99 mmol/L (ref 98–111)
Creatinine, Ser: 0.6 mg/dL (ref 0.44–1.00)
Glucose, Bld: 94 mg/dL (ref 70–99)
HCT: 43 % (ref 36.0–46.0)
Hemoglobin: 14.6 g/dL (ref 12.0–15.0)
Potassium: 3.9 mmol/L (ref 3.5–5.1)
Sodium: 136 mmol/L (ref 135–145)
TCO2: 27 mmol/L (ref 22–32)

## 2020-11-29 LAB — ECHO TEE: P 1/2 time: 475 msec

## 2020-11-29 SURGERY — ECHOCARDIOGRAM, TRANSESOPHAGEAL
Anesthesia: Monitor Anesthesia Care

## 2020-11-29 MED ORDER — GLYCOPYRROLATE PF 0.2 MG/ML IJ SOSY
PREFILLED_SYRINGE | INTRAMUSCULAR | Status: DC | PRN
  Administered 2020-11-29: .1 mg via INTRAVENOUS

## 2020-11-29 MED ORDER — BUTAMBEN-TETRACAINE-BENZOCAINE 2-2-14 % EX AERO
INHALATION_SPRAY | CUTANEOUS | Status: DC | PRN
  Administered 2020-11-29: 2 via TOPICAL

## 2020-11-29 MED ORDER — PROPOFOL 10 MG/ML IV BOLUS
INTRAVENOUS | Status: DC | PRN
  Administered 2020-11-29: 40 mg via INTRAVENOUS
  Administered 2020-11-29: 50 mg via INTRAVENOUS

## 2020-11-29 MED ORDER — LIDOCAINE 2% (20 MG/ML) 5 ML SYRINGE
INTRAMUSCULAR | Status: DC | PRN
  Administered 2020-11-29: 100 mg via INTRAVENOUS

## 2020-11-29 MED ORDER — PROPOFOL 500 MG/50ML IV EMUL
INTRAVENOUS | Status: DC | PRN
  Administered 2020-11-29: 70 ug/kg/min via INTRAVENOUS

## 2020-11-29 MED ORDER — SODIUM CHLORIDE 0.9 % IV SOLN: INTRAVENOUS | Status: DC

## 2020-11-29 MED ORDER — PHENYLEPHRINE 40 MCG/ML (10ML) SYRINGE FOR IV PUSH (FOR BLOOD PRESSURE SUPPORT)
PREFILLED_SYRINGE | INTRAVENOUS | Status: DC | PRN
  Administered 2020-11-29: 120 ug via INTRAVENOUS
  Administered 2020-11-29 (×2): 80 ug via INTRAVENOUS

## 2020-11-29 NOTE — Anesthesia Preprocedure Evaluation (Addendum)
Anesthesia Evaluation  Patient identified by MRN, date of birth, ID band Patient awake    Reviewed: Allergy & Precautions, NPO status , Patient's Chart, lab work & pertinent test results  History of Anesthesia Complications (+) PONVNegative for: history of anesthetic complications  Airway Mallampati: II  TM Distance: >3 FB Neck ROM: Full    Dental  (+) Teeth Intact, Dental Advisory Given   Pulmonary COPD, Current Smoker,    Pulmonary exam normal        Cardiovascular hypertension, Pt. on medications and Pt. on home beta blockers + CAD (seen on PE study)  Normal cardiovascular exam+ dysrhythmias Atrial Fibrillation   TTE 2022 1. Left ventricular ejection fraction, by estimation, is 55 to 60%. The  left ventricle has normal function. The left ventricle has no regional  wall motion abnormalities. Left ventricular diastolic parameters are  indeterminate.  2. Right ventricular systolic function is normal. The right ventricular  size is normal.  3. Left atrial size was mildly dilated.  4. Right atrial size was mildly dilated.  5. The mitral valve is normal in structure. Mild mitral valve  regurgitation. No evidence of mitral stenosis.  6. Tricuspid valve regurgitation is mild to moderate.  7. The aortic valve was not well visualized. Aortic valve regurgitation  is mild. Mild to moderate aortic valve sclerosis/calcification is present,  without any evidence of aortic stenosis.  8. The inferior vena cava is normal in size with greater than 50%  respiratory variability, suggesting right atrial pressure of 3 mmHg.   Neuro/Psych negative neurological ROS     GI/Hepatic negative GI ROS, Neg liver ROS,   Endo/Other  Hypothyroidism   Renal/GU negative Renal ROS  negative genitourinary   Musculoskeletal negative musculoskeletal ROS (+)   Abdominal   Peds  Hematology negative hematology ROS (+) Eliquis   Anesthesia  Other Findings   Reproductive/Obstetrics                         Anesthesia Physical Anesthesia Plan  ASA: 3  Anesthesia Plan: MAC   Post-op Pain Management:    Induction: Intravenous  PONV Risk Score and Plan: Propofol infusion, TIVA and Treatment may vary due to age or medical condition  Airway Management Planned: Natural Airway, Nasal Cannula and Simple Face Mask  Additional Equipment: None  Intra-op Plan:   Post-operative Plan:   Informed Consent: I have reviewed the patients History and Physical, chart, labs and discussed the procedure including the risks, benefits and alternatives for the proposed anesthesia with the patient or authorized representative who has indicated his/her understanding and acceptance.       Plan Discussed with:   Anesthesia Plan Comments:        Anesthesia Quick Evaluation

## 2020-11-29 NOTE — Anesthesia Postprocedure Evaluation (Signed)
Anesthesia Post Note  Patient: Deanna Hamilton  Procedure(s) Performed: TRANSESOPHAGEAL ECHOCARDIOGRAM (TEE) BUBBLE STUDY     Patient location during evaluation: Endoscopy Anesthesia Type: MAC Level of consciousness: awake and alert Pain management: pain level controlled Vital Signs Assessment: post-procedure vital signs reviewed and stable Respiratory status: spontaneous breathing, nonlabored ventilation, respiratory function stable and patient connected to nasal cannula oxygen Cardiovascular status: blood pressure returned to baseline and stable Postop Assessment: no apparent nausea or vomiting Anesthetic complications: no   No notable events documented.  Last Vitals:  Vitals:   11/29/20 1055 11/29/20 1106  BP: (!) 100/42 (!) 98/55  Pulse: 92 81  Resp: (!) 22 (!) 23  Temp:    SpO2: 96% 97%    Last Pain:  Vitals:   11/29/20 1106  TempSrc:   PainSc: 0-No pain                 Tequita Marrs L Calena Salem

## 2020-11-29 NOTE — Anesthesia Procedure Notes (Signed)
Procedure Name: MAC Date/Time: 11/29/2020 10:17 AM Performed by: Trinna Post., CRNA Pre-anesthesia Checklist: Patient identified, Emergency Drugs available, Suction available, Patient being monitored and Timeout performed Oxygen Delivery Method: Nasal cannula Placement Confirmation: positive ETCO2

## 2020-11-29 NOTE — Progress Notes (Signed)
  Echocardiogram Echocardiogram Transesophageal has been performed.  Deanna Hamilton 11/29/2020, 10:56 AM

## 2020-11-29 NOTE — H&P (Signed)
Deanna Hamilton is a 63F with paroxysmal AF, thyroid disease who presents for TEE prior to AF ablation.  Echo 05/2020 shows normal EF, no significant valvular disease.  She is on Eliquis 5mg  BID, reports compliance.  Reports palpitations when in AF.  Denies any issues with dysphagia.   GEN:  in no acute distress HEENT: normal Cardiac: regular, tachycardic, no murmurs, rubs, or gallops,no edema  Respiratory:  clear to auscultation bilaterally, normal work of breathing GI: soft, nontender Deanna: no deformity or atrophy Skin: warm and dry, no rash Neuro:  Alert and Oriented x 3, Strength and sensation are intact Psych: normal affect  Plan: -TEE today to r/o LAA thrombus prior to AF ablation tomorrow.

## 2020-11-29 NOTE — Anesthesia Preprocedure Evaluation (Addendum)
Anesthesia Evaluation  Patient identified by MRN, date of birth, ID band Patient awake    Reviewed: Allergy & Precautions, NPO status , Patient's Chart, lab work & pertinent test results  History of Anesthesia Complications (+) PONV and history of anesthetic complications  Airway Mallampati: I  TM Distance: >3 FB Neck ROM: Full    Dental  (+) Dental Advisory Given, Teeth Intact   Pulmonary COPD, Current SmokerPatient did not abstain from smoking.,    Pulmonary exam normal        Cardiovascular hypertension, + CAD  + dysrhythmias Atrial Fibrillation  Rhythm:Regular Rate:Normal   '22 TEE - EF 60 to 65%. Left atrial size was mildly dilated. Right atrial size was mildly dilated. Trivial MR. AI is mild. Evidence of atrial level shunting detected by color flow Doppler. Agitated saline contrast bubble study was positive with shunting observed within 3-6 cardiac cycles suggestive of interatrial shunt. There is a small patent foramen ovale.     Neuro/Psych PSYCHIATRIC DISORDERS Anxiety negative neurological ROS     GI/Hepatic negative GI ROS, Neg liver ROS,   Endo/Other  Hypothyroidism   Renal/GU negative Renal ROS     Musculoskeletal negative musculoskeletal ROS (+)   Abdominal   Peds  Hematology negative hematology ROS (+)  On eliquis    Anesthesia Other Findings   Reproductive/Obstetrics                           Anesthesia Physical Anesthesia Plan  ASA: 3  Anesthesia Plan: General   Post-op Pain Management:    Induction: Intravenous  PONV Risk Score and Plan: 4 or greater and Treatment may vary due to age or medical condition, Ondansetron, Dexamethasone and Aprepitant  Airway Management Planned: Oral ETT  Additional Equipment: None  Intra-op Plan:   Post-operative Plan: Extubation in OR  Informed Consent: I have reviewed the patients History and Physical, chart, labs and  discussed the procedure including the risks, benefits and alternatives for the proposed anesthesia with the patient or authorized representative who has indicated his/her understanding and acceptance.     Dental advisory given  Plan Discussed with: CRNA and Anesthesiologist  Anesthesia Plan Comments:        Anesthesia Quick Evaluation

## 2020-11-29 NOTE — CV Procedure (Signed)
     TRANSESOPHAGEAL ECHOCARDIOGRAM   NAME:  Deanna Hamilton   MRN: [id number] DOB:  Oct 06, 1950   ADMIT DATE: 11/29/2020  INDICATIONS: Pre-AF ablation  PROCEDURE:   Informed consent was obtained prior to the procedure. The risks, benefits and alternatives for the procedure were discussed and the patient comprehended these risks.  Risks include, but are not limited to, cough, sore throat, vomiting, nausea, somnolence, esophageal and stomach trauma or perforation, bleeding, low blood pressure, aspiration, pneumonia, infection, trauma to the teeth and death.    After a procedural time-out, the oropharynx was anesthetized and the patient was sedated by the anesthesia service. The transesophageal probe was inserted in the esophagus and stomach without difficulty and multiple views were obtained. Anesthesia was monitored by Lillette Boxer, CRNA.    COMPLICATIONS:    There were no immediate complications.  FINDINGS:  No LAA thrombus.  Small PFO.  Mild AI.   Epifanio Lesches MD [facility]  [address] [place], [place] [address] [phone]   10:43 AM

## 2020-11-29 NOTE — Transfer of Care (Addendum)
Immediate Anesthesia Transfer of Care Note  Patient: Deanna Hamilton  Procedure(s) Performed: TRANSESOPHAGEAL ECHOCARDIOGRAM (TEE) BUBBLE STUDY  Patient Location: Endoscopy Unit  Anesthesia Type:MAC  Level of Consciousness: awake and alert   Airway & Oxygen Therapy: Patient Spontanous Breathing  Post-op Assessment: Report given to RN and Post -op Vital signs reviewed and stable  Post vital signs: Reviewed and stable  Last Vitals:  Vitals Value Taken Time  BP 108/55 11/29/20 1046  Temp    Pulse 98 11/29/20 1046  Resp 21 11/29/20 1046  SpO2 95 % 11/29/20 1046  Vitals shown include unvalidated device data.  Last Pain:  Vitals:   11/29/20 0940  TempSrc: Oral  PainSc: 0-No pain         Complications: No notable events documented.

## 2020-11-29 NOTE — Pre-Procedure Instructions (Signed)
Attempted to call patient regarding procedure instructions for tomorrow.   Instructed patient on the following items: Arrival time 0530 Nothing to eat or drink after midnight No meds AM of procedure Responsible person to drive you home and stay with you for 24 hrs  Have you missed any doses of anti-coagulant Eliquis- take both doses today, don't take any in the morning

## 2020-11-29 NOTE — Discharge Instructions (Signed)

## 2020-11-30 ENCOUNTER — Ambulatory Visit (HOSPITAL_COMMUNITY): Payer: Medicare Other | Admitting: Anesthesiology

## 2020-11-30 ENCOUNTER — Encounter (HOSPITAL_COMMUNITY)
Admission: RE | Disposition: A | Discharge status: Home / Self Care | Payer: Self-pay | Source: Home / Self Care | Attending: Internal Medicine

## 2020-11-30 ENCOUNTER — Other Ambulatory Visit: Payer: Self-pay

## 2020-11-30 ENCOUNTER — Encounter (HOSPITAL_COMMUNITY): Payer: Self-pay | Admitting: Internal Medicine

## 2020-11-30 ENCOUNTER — Ambulatory Visit (HOSPITAL_COMMUNITY)
Admission: RE | Admit: 2020-11-30 | Discharge: 2020-11-30 | Disposition: A | Discharge status: Home / Self Care | Payer: Medicare Other | Attending: Internal Medicine | Admitting: Internal Medicine

## 2020-11-30 DIAGNOSIS — Z7901 Long term (current) use of anticoagulants: Secondary | ICD-10-CM | POA: Insufficient documentation

## 2020-11-30 DIAGNOSIS — Z79890 Hormone replacement therapy (postmenopausal): Secondary | ICD-10-CM | POA: Diagnosis not present

## 2020-11-30 DIAGNOSIS — Z79899 Other long term (current) drug therapy: Secondary | ICD-10-CM | POA: Diagnosis not present

## 2020-11-30 DIAGNOSIS — F17210 Nicotine dependence, cigarettes, uncomplicated: Secondary | ICD-10-CM | POA: Diagnosis not present

## 2020-11-30 DIAGNOSIS — I491 Atrial premature depolarization: Secondary | ICD-10-CM | POA: Diagnosis not present

## 2020-11-30 DIAGNOSIS — I484 Atypical atrial flutter: Secondary | ICD-10-CM | POA: Diagnosis not present

## 2020-11-30 DIAGNOSIS — I10 Essential (primary) hypertension: Secondary | ICD-10-CM | POA: Diagnosis not present

## 2020-11-30 DIAGNOSIS — Z888 Allergy status to other drugs, medicaments and biological substances status: Secondary | ICD-10-CM | POA: Diagnosis not present

## 2020-11-30 DIAGNOSIS — I480 Paroxysmal atrial fibrillation: Secondary | ICD-10-CM | POA: Diagnosis not present

## 2020-11-30 DIAGNOSIS — I4819 Other persistent atrial fibrillation: Secondary | ICD-10-CM | POA: Insufficient documentation

## 2020-11-30 DIAGNOSIS — I471 Supraventricular tachycardia: Secondary | ICD-10-CM | POA: Diagnosis not present

## 2020-11-30 DIAGNOSIS — E039 Hypothyroidism, unspecified: Secondary | ICD-10-CM | POA: Diagnosis not present

## 2020-11-30 DIAGNOSIS — I2510 Atherosclerotic heart disease of native coronary artery without angina pectoris: Secondary | ICD-10-CM | POA: Diagnosis not present

## 2020-11-30 DIAGNOSIS — Z8249 Family history of ischemic heart disease and other diseases of the circulatory system: Secondary | ICD-10-CM | POA: Diagnosis not present

## 2020-11-30 DIAGNOSIS — Z881 Allergy status to other antibiotic agents status: Secondary | ICD-10-CM | POA: Diagnosis not present

## 2020-11-30 HISTORY — PX: ATRIAL FIBRILLATION ABLATION: EP1191

## 2020-11-30 LAB — CBC
HCT: 41.8 % (ref 36.0–46.0)
Hemoglobin: 13.5 g/dL (ref 12.0–15.0)
MCH: 29.7 pg (ref 26.0–34.0)
MCHC: 32.3 g/dL (ref 30.0–36.0)
MCV: 91.9 fL (ref 80.0–100.0)
Platelets: 203 10*3/uL (ref 150–400)
RBC: 4.55 MIL/uL (ref 3.87–5.11)
RDW: 14.9 % (ref 11.5–15.5)
WBC: 4.3 10*3/uL (ref 4.0–10.5)
nRBC: 0 % (ref 0.0–0.2)

## 2020-11-30 LAB — POCT ACTIVATED CLOTTING TIME: Activated Clotting Time: 381 seconds

## 2020-11-30 SURGERY — ATRIAL FIBRILLATION ABLATION
Anesthesia: General

## 2020-11-30 MED ORDER — APREPITANT 40 MG PO CAPS
40.0000 mg | ORAL_CAPSULE | ORAL | Status: AC
  Administered 2020-11-30: 40 mg via ORAL
  Filled 2020-11-30 (×3): qty 1

## 2020-11-30 MED ORDER — ISOPROTERENOL HCL 0.2 MG/ML IJ SOLN
INTRAVENOUS | Status: DC | PRN
  Administered 2020-11-30: 10 ug/min via INTRAVENOUS

## 2020-11-30 MED ORDER — ISOPROTERENOL HCL 0.2 MG/ML IJ SOLN
INTRAMUSCULAR | Status: AC
  Filled 2020-11-30: qty 5

## 2020-11-30 MED ORDER — PANTOPRAZOLE SODIUM 40 MG PO TBEC: 40.0000 mg | DELAYED_RELEASE_TABLET | Freq: Every day | ORAL | 0 refills | Status: DC

## 2020-11-30 MED ORDER — ONDANSETRON HCL 4 MG/2ML IJ SOLN: 4.0000 mg | Freq: Four times a day (QID) | INTRAMUSCULAR | Status: DC | PRN

## 2020-11-30 MED ORDER — HEPARIN (PORCINE) IN NACL 1000-0.9 UT/500ML-% IV SOLN
INTRAVENOUS | Status: DC | PRN
  Administered 2020-11-30: 500 mL

## 2020-11-30 MED ORDER — SODIUM CHLORIDE 0.9 % IV SOLN: INTRAVENOUS | Status: DC

## 2020-11-30 MED ORDER — PROTAMINE SULFATE 10 MG/ML IV SOLN
INTRAVENOUS | Status: DC | PRN
  Administered 2020-11-30: 40 mg via INTRAVENOUS

## 2020-11-30 MED ORDER — ONDANSETRON HCL 4 MG/2ML IJ SOLN
INTRAMUSCULAR | Status: DC | PRN
  Administered 2020-11-30: 4 mg via INTRAVENOUS

## 2020-11-30 MED ORDER — EPHEDRINE SULFATE-NACL 50-0.9 MG/10ML-% IV SOSY
PREFILLED_SYRINGE | INTRAVENOUS | Status: DC | PRN
  Administered 2020-11-30 (×2): 5 mg via INTRAVENOUS

## 2020-11-30 MED ORDER — SODIUM CHLORIDE 0.9% FLUSH
3.0000 mL | Freq: Two times a day (BID) | INTRAVENOUS | Status: DC
  Administered 2020-11-30: 3 mL via INTRAVENOUS

## 2020-11-30 MED ORDER — SODIUM CHLORIDE 0.9 % IV SOLN: 250.0000 mL | INTRAVENOUS | Status: DC | PRN

## 2020-11-30 MED ORDER — HEPARIN SODIUM (PORCINE) 1000 UNIT/ML IJ SOLN
INTRAMUSCULAR | Status: AC
  Filled 2020-11-30: qty 1

## 2020-11-30 MED ORDER — ROCURONIUM BROMIDE 10 MG/ML (PF) SYRINGE
PREFILLED_SYRINGE | INTRAVENOUS | Status: DC | PRN
  Administered 2020-11-30: 50 mg via INTRAVENOUS

## 2020-11-30 MED ORDER — ALBUTEROL SULFATE HFA 108 (90 BASE) MCG/ACT IN AERS
INHALATION_SPRAY | RESPIRATORY_TRACT | Status: DC | PRN
  Administered 2020-11-30: 3 via RESPIRATORY_TRACT

## 2020-11-30 MED ORDER — PROPOFOL 10 MG/ML IV BOLUS
INTRAVENOUS | Status: DC | PRN
  Administered 2020-11-30: 110 mg via INTRAVENOUS

## 2020-11-30 MED ORDER — APIXABAN 5 MG PO TABS
5.0000 mg | ORAL_TABLET | ORAL | Status: AC
  Administered 2020-11-30: 5 mg via ORAL
  Filled 2020-11-30: qty 1

## 2020-11-30 MED ORDER — SODIUM CHLORIDE 0.9% FLUSH: 3.0000 mL | INTRAVENOUS | Status: DC | PRN

## 2020-11-30 MED ORDER — SUGAMMADEX SODIUM 200 MG/2ML IV SOLN
INTRAVENOUS | Status: DC | PRN
  Administered 2020-11-30: 200 mg via INTRAVENOUS

## 2020-11-30 MED ORDER — DEXAMETHASONE SODIUM PHOSPHATE 10 MG/ML IJ SOLN
INTRAMUSCULAR | Status: DC | PRN
  Administered 2020-11-30: 10 mg via INTRAVENOUS

## 2020-11-30 MED ORDER — HEPARIN SODIUM (PORCINE) 1000 UNIT/ML IJ SOLN
INTRAMUSCULAR | Status: DC | PRN
  Administered 2020-11-30: 1000 [IU] via INTRAVENOUS
  Administered 2020-11-30: 15000 [IU] via INTRAVENOUS

## 2020-11-30 MED ORDER — LIDOCAINE 2% (20 MG/ML) 5 ML SYRINGE
INTRAMUSCULAR | Status: DC | PRN
  Administered 2020-11-30: 40 mg via INTRAVENOUS

## 2020-11-30 MED ORDER — HEPARIN (PORCINE) IN NACL 1000-0.9 UT/500ML-% IV SOLN
INTRAVENOUS | Status: AC
  Filled 2020-11-30: qty 500

## 2020-11-30 MED ORDER — ACETAMINOPHEN 325 MG PO TABS: 650.0000 mg | ORAL_TABLET | ORAL | Status: DC | PRN

## 2020-11-30 MED ORDER — PHENYLEPHRINE HCL-NACL 10-0.9 MG/250ML-% IV SOLN
INTRAVENOUS | Status: DC | PRN
  Administered 2020-11-30: 40 ug/min via INTRAVENOUS

## 2020-11-30 MED ORDER — HEPARIN SODIUM (PORCINE) 1000 UNIT/ML IJ SOLN
INTRAMUSCULAR | Status: DC | PRN
  Administered 2020-11-30: 1000 [IU] via INTRAVENOUS

## 2020-11-30 MED ORDER — PHENYLEPHRINE 40 MCG/ML (10ML) SYRINGE FOR IV PUSH (FOR BLOOD PRESSURE SUPPORT)
PREFILLED_SYRINGE | INTRAVENOUS | Status: DC | PRN
  Administered 2020-11-30 (×2): 80 ug via INTRAVENOUS

## 2020-11-30 MED ORDER — FENTANYL CITRATE (PF) 250 MCG/5ML IJ SOLN
INTRAMUSCULAR | Status: DC | PRN
  Administered 2020-11-30: 50 ug via INTRAVENOUS

## 2020-11-30 SURGICAL SUPPLY — 20 items
BLANKET WARM UNDERBOD FULL ACC (MISCELLANEOUS) ×2 IMPLANT
CATH 8FR REPROCESSED SOUNDSTAR (CATHETERS) ×2 IMPLANT
CATH 8FR SOUNDSTAR REPROCESSED (CATHETERS) IMPLANT
CATH MAPPNG PENTARAY F 2-6-2MM (CATHETERS) IMPLANT
CATH SMTCH THERMOCOOL SF DF (CATHETERS) ×1 IMPLANT
CATH WEB BI DIR CSDF CRV REPRO (CATHETERS) ×1 IMPLANT
CLOSURE PERCLOSE PROSTYLE (VASCULAR PRODUCTS) ×3 IMPLANT
COVER SWIFTLINK CONNECTOR (BAG) ×2 IMPLANT
NDL BAYLIS TRANSSEPTAL 71CM (NEEDLE) IMPLANT
NEEDLE BAYLIS TRANSSEPTAL 71CM (NEEDLE) ×2 IMPLANT
PACK EP LATEX FREE (CUSTOM PROCEDURE TRAY) ×2
PACK EP LF (CUSTOM PROCEDURE TRAY) ×1 IMPLANT
PAD PRO RADIOLUCENT 2001M-C (PAD) ×2 IMPLANT
PATCH CARTO3 (PAD) ×1 IMPLANT
PENTARAY F 2-6-2MM (CATHETERS) ×2
SHEATH PINNACLE 7F 10CM (SHEATH) ×2 IMPLANT
SHEATH PINNACLE 9F 10CM (SHEATH) ×1 IMPLANT
SHEATH PROBE COVER 6X72 (BAG) ×1 IMPLANT
SHEATH SWARTZ TS SL2 63CM 8.5F (SHEATH) ×1 IMPLANT
TUBING SMART ABLATE COOLFLOW (TUBING) ×1 IMPLANT

## 2020-11-30 NOTE — Progress Notes (Signed)
Patient's BPs remain soft, 85/50.  500 CC bolus started per MD Allred.  Patient asymptomatic and transferred to procedural short stay.  Report given to Page, RN.

## 2020-11-30 NOTE — Transfer of Care (Signed)
Immediate Anesthesia Transfer of Care Note  Patient: Deanna Hamilton  Procedure(s) Performed: ATRIAL FIBRILLATION ABLATION  Patient Location: Cath Lab  Anesthesia Type:General  Level of Consciousness: awake, alert  and oriented  Airway & Oxygen Therapy: Patient Spontanous Breathing and Patient connected to face mask oxygen  Post-op Assessment: Report given to RN, Post -op Vital signs reviewed and stable and Patient moving all extremities  Post vital signs: Reviewed and stable  Last Vitals:  Vitals Value Taken Time  BP 101/53 11/30/20 0950  Temp 36.3 C 11/30/20 0948  Pulse 92 11/30/20 0952  Resp 24 11/30/20 0952  SpO2 97 % 11/30/20 0952  Vitals shown include unvalidated device data.  Last Pain:  Vitals:   11/30/20 0948  TempSrc: Temporal  PainSc: 0-No pain      Patients Stated Pain Goal: 3 (11/30/20 0612)  Complications: No notable events documented.

## 2020-11-30 NOTE — Progress Notes (Signed)
Pt ambulated without difficulty or bleeding.   Discharged home with her daughter who will drive and stay with pt x 24 hrs. 

## 2020-11-30 NOTE — Anesthesia Postprocedure Evaluation (Signed)
Anesthesia Post Note  Patient: Deanna Hamilton  Procedure(s) Performed: ATRIAL FIBRILLATION ABLATION     Patient location during evaluation: PACU Anesthesia Type: General Level of consciousness: awake and alert Pain management: pain level controlled Vital Signs Assessment: post-procedure vital signs reviewed and stable Respiratory status: spontaneous breathing, nonlabored ventilation and respiratory function stable Cardiovascular status: stable and blood pressure returned to baseline Anesthetic complications: no   No notable events documented.  Last Vitals:  Vitals:   11/30/20 1105 11/30/20 1106  BP:  (!) 85/50  Pulse: 75 84  Resp:  15  Temp:    SpO2: 94% 96%    Last Pain:  Vitals:   11/30/20 1025  TempSrc: Temporal  PainSc:                  [name]

## 2020-11-30 NOTE — Anesthesia Procedure Notes (Addendum)
Procedure Name: Intubation Date/Time: 11/30/2020 7:49 AM Performed by: Kyung Rudd, CRNA Pre-anesthesia Checklist: Patient identified, Emergency Drugs available, Suction available and Patient being monitored Patient Re-evaluated:Patient Re-evaluated prior to induction Oxygen Delivery Method: Circle System Utilized Preoxygenation: Pre-oxygenation with 100% oxygen Induction Type: IV induction Ventilation: Mask ventilation without difficulty Laryngoscope Size: Mac and 3 Grade View: Grade I Tube type: Oral Tube size: 7.0 mm Number of attempts: 1 Airway Equipment and Method: Stylet and Oral airway Placement Confirmation: ETT inserted through vocal cords under direct vision, positive ETCO2 and breath sounds checked- equal and bilateral Secured at: 21 cm Tube secured with: Tape Dental Injury: Teeth and Oropharynx as per pre-operative assessment

## 2020-11-30 NOTE — Discharge Instructions (Addendum)
Post procedure care instructions No driving for 4 days. No lifting over 5 lbs for 1 week. No vigorous or sexual activity for 1 week. You may return to work/your usual activities on 12/08/20. Keep procedure site clean & dry. If you notice increased pain, swelling, bleeding or pus, call/return!  You may shower after 24 hours, but no soaking in baths/hot tubs/pools for 1 week.   You have an appointment set up with the Atrial Fibrillation Clinic.  Multiple studies have shown that being followed by a dedicated atrial fibrillation clinic in addition to the standard care you receive from your other physicians improves health. We believe that enrollment in the atrial fibrillation clinic will allow us to better care for you.   The phone number to the Atrial Fibrillation Clinic is [phone]. The clinic is staffed Monday through Friday from 8:30am to 5pm.  Parking Directions: The clinic is located in the Heart and Vascular Building connected to [facility]. 1)From [address] turn on to [address] and go to the 3rd entrance  (Heart and Vascular entrance) on the right. 2)Look to the right for Heart &Vascular Parking Garage. 3)A code for the entrance is required, for August is 5544.   4)Take the elevators to the 1st floor. Registration is in the room with the glass walls at the end of the hallway.  If you have any trouble parking or locating the clinic, please don't hesitate to call [phone].    Cardiac Ablation, Care After  This sheet gives you information about how to care for yourself after your procedure. Your health care provider may also give you more specific instructions. If you have problems or questions, contact your health care provider. What can I expect after the procedure? After the procedure, it is common to have: Bruising around your puncture site. Tenderness around your puncture site. Skipped heartbeats. Tiredness (fatigue).  Follow these instructions at  home: Puncture site care  Follow instructions from your health care provider about how to take care of your puncture site. Make sure you: If present, leave stitches (sutures), skin glue, or adhesive strips in place. These skin closures may need to stay in place for up to 2 weeks. If adhesive strip edges start to loosen and curl up, you may trim the loose edges. Do not remove adhesive strips completely unless your health care provider tells you to do that. If a large square bandage is present, this may be removed 24 hours after surgery.  Check your puncture site every day for signs of infection. Check for: Redness, swelling, or pain. Fluid or blood. If your puncture site starts to bleed, lie down on your back, apply firm pressure to the area, and contact your health care provider. Warmth. Pus or a bad smell. Driving Do not drive for at least 4 days after your procedure or however long your health care provider recommends. (Do not resume driving if you have previously been instructed not to drive for other health reasons.) Do not drive or use heavy machinery while taking prescription pain medicine. Activity Avoid activities that take a lot of effort for at least 7 days after your procedure. Do not lift anything that is heavier than 5 lb (4.5 kg) for one week.  No sexual activity for 1 week.  Return to your normal activities as told by your health care provider. Ask your health care provider what activities are safe for you. General instructions Take over-the-counter and prescription medicines only as told by your health care   provider. Do not use any products that contain nicotine or tobacco, such as cigarettes and e-cigarettes. If you need help quitting, ask your health care provider. You may shower after 24 hours, but Do not take baths, swim, or use a hot tub for 1 week.  Do not drink alcohol for 24 hours after your procedure. Keep all follow-up visits as told by your health care provider. This  is important. Contact a health care provider if: You have redness, mild swelling, or pain around your puncture site. You have fluid or blood coming from your puncture site that stops after applying firm pressure to the area. Your puncture site feels warm to the touch. You have pus or a bad smell coming from your puncture site. You have a fever. You have chest pain or discomfort that spreads to your neck, jaw, or arm. You are sweating a lot. You feel nauseous. You have a fast or irregular heartbeat. You have shortness of breath. You are dizzy or light-headed and feel the need to lie down. You have pain or numbness in the arm or leg closest to your puncture site. Get help right away if: Your puncture site suddenly swells. Your puncture site is bleeding and the bleeding does not stop after applying firm pressure to the area. These symptoms may represent a serious problem that is an emergency. Do not wait to see if the symptoms will go away. Get medical help right away. Call your local emergency services (911 in the [place] Do not drive yourself to the hospital. Summary After the procedure, it is normal to have bruising and tenderness at the puncture site in your groin, neck, or forearm. Check your puncture site every day for signs of infection. Get help right away if your puncture site is bleeding and the bleeding does not stop after applying firm pressure to the area. This is a medical emergency. This information is not intended to replace advice given to you by your health care provider. Make sure you discuss any questions you have with your health care provider.   

## 2020-11-30 NOTE — H&P (Signed)
CC: afib   History of Present Illness: Deanna Hamilton is a 70 y.o. female who presents today for electrophysiology study and ablation for afib.     The patient was initially diagnosed with afib 05/2020 after presenting to the ED with weakness and fatigue. She was cardioverted but quickly returned to AF.  She has been placed on flecainide but did not tolerate this medicine due to side effects.   She has frequent tachypalpitations.  She appears to have intermittent but daily afib.   Today, she denies symptoms of chest pain, shortness of breath, dizziness, presyncope, syncope,  or neurologic sequela. The patient is tolerating medications without difficulties and is otherwise without complaint today.         Past Medical History:  Diagnosis Date   A-fib (HCC)     Anxiety     Complication of anesthesia     Hypertension     Hypothyroidism     PONV (postoperative nausea and vomiting)     Thyroid disease           Past Surgical History:  Procedure Laterality Date   ABDOMINAL HYSTERECTOMY       CARDIOVERSION N/A 07/17/2020    Procedure: CARDIOVERSION;  Surgeon: Christell Constant, MD;  Location: [facility];  Service: Cardiovascular;  Laterality: N/A;              Current Outpatient Medications  Medication Sig Dispense Refill   apixaban (ELIQUIS) 5 MG TABS tablet Take 1 tablet (5 mg total) by mouth 2 (two) times daily. 60 tablet 11   diltiazem (CARDIZEM CD) 120 MG 24 hr capsule Take 1 capsule (120 mg total) by mouth 2 (two) times daily. 180 capsule 3   diltiazem (CARDIZEM) 30 MG tablet TAKE 1 TABLET BY MOUTH EVERY 8 HOURS AS NEEDED 60 tablet 2   famotidine (PEPCID) 10 MG tablet Take 10 mg by mouth 2 (two) times daily as needed for heartburn or indigestion. (Patient not taking: Reported on 08/21/2020)       ibuprofen (ADVIL) 200 MG tablet Take 200 mg by mouth every 8 (eight) hours as needed (PAIN).       levothyroxine (SYNTHROID) 88 MCG tablet Take 88 mcg by mouth daily before  breakfast.        No current facility-administered medications for this visit.      Allergies:   Erythromycin, Prednisone, and Xanax [alprazolam]    Social History:  The patient  reports that she has been smoking cigarettes. She has been smoking about 0.50 packs per day. She uses smokeless tobacco. She reports that she does not drink alcohol and does not use drugs.    Family History:  + CAD,  Denies AF     ROS:  Please see the history of present illness.   All other systems are personally reviewed and negative.     PHYSICAL EXAM: Vitals:   11/30/20 0603  BP: (!) 109/95  Pulse: 92  Temp: 98.3 F (36.8 C)  SpO2: 95%   GEN: Well nourished, well developed, in no acute distress HEENT: normal Neck: no JVD, carotid bruits, or masses Cardiac: iRRR Respiratory:  clear to auscultation bilaterally, normal work of breathing GI: soft MS: no deformity or atrophy Skin: warm and dry Neuro:  Strength and sensation are intact Psych: euthymic mood, full affect         Other studies personally reviewed: Additional studies/ records that were reviewed today include: AF clinic notes, prior echo  Review of the above  records today demonstrates: as above     ASSESSMENT AND PLAN:   1.  Persistent atrial fibrillation The patient has symptomatic, recurrent  atrial fibrillation.  Today, she is intermittently in sinus but frequently in rapid afib vs atach. she has failed medical therapy with flecainide. Chads2vasc score is 2.  she is anticoagulated with eliquis .   Risk, benefits, and alternatives to EP study and radiofrequency ablation for afib were again discussed in detail today. These risks include but are not limited to stroke, bleeding, vascular damage, tamponade, perforation, damage to the esophagus, lungs, and other structures, pulmonary vein stenosis, worsening renal function, and death. The patient understands these risk and wishes to proceed.   TEE reviewed at length with the patient  today.  she reports compliance with OAC without interruption.  Hillis Range MD, [facility] 11/30/2020 6:58 AM

## 2020-12-28 ENCOUNTER — Encounter (HOSPITAL_COMMUNITY): Payer: Self-pay | Admitting: Physician Assistant

## 2020-12-28 ENCOUNTER — Other Ambulatory Visit: Payer: Self-pay

## 2020-12-28 ENCOUNTER — Ambulatory Visit (HOSPITAL_COMMUNITY)
Admission: RE | Admit: 2020-12-28 | Discharge: 2020-12-28 | Disposition: A | Discharge status: Home / Self Care | Payer: Medicare Other | Source: Ambulatory Visit | Attending: Physician Assistant | Admitting: Physician Assistant

## 2020-12-28 VITALS — BP 140/82 | HR 103 | Ht 65.0 in | Wt 111.0 lb

## 2020-12-28 DIAGNOSIS — Z881 Allergy status to other antibiotic agents status: Secondary | ICD-10-CM | POA: Insufficient documentation

## 2020-12-28 DIAGNOSIS — Z7901 Long term (current) use of anticoagulants: Secondary | ICD-10-CM | POA: Diagnosis not present

## 2020-12-28 DIAGNOSIS — Z8616 Personal history of COVID-19: Secondary | ICD-10-CM | POA: Insufficient documentation

## 2020-12-28 DIAGNOSIS — Z885 Allergy status to narcotic agent status: Secondary | ICD-10-CM | POA: Diagnosis not present

## 2020-12-28 DIAGNOSIS — F17210 Nicotine dependence, cigarettes, uncomplicated: Secondary | ICD-10-CM | POA: Diagnosis not present

## 2020-12-28 DIAGNOSIS — I4819 Other persistent atrial fibrillation: Secondary | ICD-10-CM | POA: Diagnosis not present

## 2020-12-28 DIAGNOSIS — Z79899 Other long term (current) drug therapy: Secondary | ICD-10-CM | POA: Insufficient documentation

## 2020-12-28 DIAGNOSIS — I10 Essential (primary) hypertension: Secondary | ICD-10-CM | POA: Diagnosis not present

## 2020-12-28 DIAGNOSIS — Z888 Allergy status to other drugs, medicaments and biological substances status: Secondary | ICD-10-CM | POA: Diagnosis not present

## 2020-12-28 DIAGNOSIS — I4892 Unspecified atrial flutter: Secondary | ICD-10-CM | POA: Insufficient documentation

## 2020-12-28 DIAGNOSIS — E039 Hypothyroidism, unspecified: Secondary | ICD-10-CM | POA: Insufficient documentation

## 2020-12-28 NOTE — Progress Notes (Signed)
Primary Care Physician: Shirlean Mylar, MD Primary Cardiologist: Dr Anne Fu Primary Electrophysiologist: Dr Johney Frame Referring Physician: Manson Passey PA   Deanna Hamilton is a 70 y.o. female with a history of HTN, hypothyroidism, atrial flutter, and atrial fibrillation who presents for follow up in the [facility].  The patient was initially diagnosed with atrial fibrillation 05/2020 after presenting to the ED with symptoms of lightheadedness and weakness. ECG showed afib with RVR. Patient is on Eliquis for a CHADS2VASC score of 2. She underwent DCCV on 07/17/20 but unfortunately quickly reverted back to afib. She was started flecainide but developed symptoms of burning on her face and upper lip, presented to the ED. Flecainide was discontinued. Patient does report she was diagnosed with COVID around the time of her afib diagnosis. She denies significant snoring or alcohol use.   On follow up today, patient is s/p afib ablation on 11/30/20. Patient reports that she did feel fatigued for about two weeks post ablation but recently has been feeling well. She states "I feel like I'm back to my old self." She denies CP, swallowing pain, or groin issues. She denies any bleeding issues on anticoagulation.   Today, she denies symptoms of palpitations, chest pain, shortness of breath, orthopnea, PND, lower extremity edema, dizziness, presyncope, syncope, snoring, daytime somnolence, bleeding, or neurologic sequela. The patient is tolerating medications without difficulties and is otherwise without complaint today.    Atrial Fibrillation Risk Factors:  she does not have symptoms or diagnosis of sleep apnea. she does not have a history of rheumatic fever. she does not have a history of alcohol use. The patient does not have a history of early familial atrial fibrillation or other arrhythmias.  she has a BMI of Body mass index is 18.47 kg/m.Marland Kitchen Filed Weights   12/28/20 1019   Weight: 50.3 kg    No family history on file.   Atrial Fibrillation Management history:  Previous antiarrhythmic drugs: flecainide  Previous cardioversions: 07/17/20 Previous ablations: 11/30/20 CHADS2VASC score: 2 Anticoagulation history: Eliquis   Past Medical History:  Diagnosis Date   A-fib (HCC)    Anxiety    Complication of anesthesia    Hypertension    Hypothyroidism    PONV (postoperative nausea and vomiting)    Thyroid disease    Past Surgical History:  Procedure Laterality Date   ABDOMINAL HYSTERECTOMY     ATRIAL FIBRILLATION ABLATION N/A 11/30/2020   Procedure: ATRIAL FIBRILLATION ABLATION;  Surgeon: Hillis Range, MD;  [facility]: [facility] INVASIVE CV LAB;  Service: Cardiovascular;  Laterality: N/A;   BUBBLE STUDY  11/29/2020   Procedure: BUBBLE STUDY;  Surgeon: Little Ishikawa, MD;  Location: [facility] ENDOSCOPY;  Service: Cardiovascular;;   CARDIOVERSION N/A 07/17/2020   Procedure: CARDIOVERSION;  Surgeon: Christell Constant, MD;  Location: [facility] ENDOSCOPY;  Service: Cardiovascular;  Laterality: N/A;   TEE WITHOUT CARDIOVERSION N/A 11/29/2020   Procedure: TRANSESOPHAGEAL ECHOCARDIOGRAM (TEE);  Surgeon: Little Ishikawa, MD;  Location: [facility] ENDOSCOPY;  Service: Cardiovascular;  Laterality: N/A;  ABLATION    Current Outpatient Medications  Medication Sig Dispense Refill   apixaban (ELIQUIS) 5 MG TABS tablet Take 1 tablet (5 mg total) by mouth 2 (two) times daily. 60 tablet 11   diltiazem (CARDIZEM CD) 120 MG 24 hr capsule Take 1 capsule (120 mg total) by mouth 2 (two) times daily. 180 capsule 3   diltiazem (CARDIZEM) 30 MG tablet TAKE 1 TABLET BY MOUTH EVERY 8 HOURS AS NEEDED 60 tablet 2  ibuprofen (ADVIL) 200 MG tablet Take 200 mg by mouth every 8 (eight) hours as needed for moderate pain.     levothyroxine (SYNTHROID) 88 MCG tablet Take 88 mcg by mouth daily before breakfast.     pantoprazole (PROTONIX) 40 MG tablet Take 1 tablet (40 mg total) by mouth daily. 45  tablet 0   No current facility-administered medications for this encounter.    Allergies  Allergen Reactions   Cephalexin Diarrhea   Erythromycin Nausea Only   Flecainide Swelling    Angioedema    Hydrocodone Nausea And Vomiting   Prednisone Other (See Comments)    Skin Crawling   Xanax [Alprazolam] Other (See Comments)    Skin Crawling   Doxycycline Calcium Rash    Social History   Socioeconomic History   Marital status: Widowed    Spouse name: Not on file   Number of children: Not on file   Years of education: Not on file   Highest education level: Not on file  Occupational History   Not on file  Tobacco Use   Smoking status: Every Day    Packs/day: 0.50    Types: Cigarettes   Smokeless tobacco: Never   Tobacco comments:    9 cigarettes daily 12/28/20  Vaping Use   Vaping Use: Never used  Substance and Sexual Activity   Alcohol use: No   Drug use: No   Sexual activity: Not on file  Other Topics Concern   Not on file  Social History Narrative   Not on file   Social Determinants of Health   Financial Resource Strain: Not on file  Food Insecurity: Not on file  Transportation Needs: Not on file  Physical Activity: Not on file  Stress: Not on file  Social Connections: Not on file  Intimate Partner Violence: Not on file     ROS- All systems are reviewed and negative except as per the HPI above.  Physical Exam: Vitals:   12/28/20 1019  BP: 140/82  Pulse: (!) 103  Weight: 50.3 kg  Height: 5\' 5"  (1.651 m)    GEN- The patient is a well appearing female, alert and oriented x 3 today.   HEENT-head normocephalic, atraumatic, sclera clear, conjunctiva pink, hearing intact, trachea midline. Lungs- Clear to ausculation bilaterally, normal work of breathing Heart- Regular rate and rhythm, no murmurs, rubs or gallops  GI- soft, NT, ND, + BS Extremities- no clubbing, cyanosis, or edema MS- no significant deformity or atrophy Skin- no rash or lesion Psych-  euthymic mood, full affect Neuro- strength and sensation are intact   Wt Readings from Last 3 Encounters:  12/28/20 50.3 kg  11/30/20 52.2 kg  11/29/20 52.2 kg    EKG today demonstrates  Sinus tach, PAC Vent. rate 103 BPM PR interval 152 ms QRS duration 78 ms QT/QTcB 324/424 ms  Echo 06/07/20 demonstrated  1. Left ventricular ejection fraction, by estimation, is 55 to 60%. The  left ventricle has normal function. The left ventricle has no regional  wall motion abnormalities. Left ventricular diastolic parameters are  indeterminate.   2. Right ventricular systolic function is normal. The right ventricular  size is normal.   3. Left atrial size was mildly dilated.   4. Right atrial size was mildly dilated.   5. The mitral valve is normal in structure. Mild mitral valve  regurgitation. No evidence of mitral stenosis.   6. Tricuspid valve regurgitation is mild to moderate.   7. The aortic valve was not well visualized.  Aortic valve regurgitation  is mild. Mild to moderate aortic valve sclerosis/calcification is present,  without any evidence of aortic stenosis.   8. The inferior vena cava is normal in size with greater than 50%  respiratory variability, suggesting right atrial pressure of 3 mmHg.   Epic records are reviewed at length today  CHA2DS2-VASc Score = 2  The patient's score is based upon: CHF History: No HTN History: No Diabetes History: No Stroke History: No Vascular Disease History: No Age Score: 1 Gender Score: 1      ASSESSMENT AND PLAN: 1. Persistent Atrial Fibrillation/atrial flutter/atach The patient's CHA2DS2-VASc score is 2, indicating a 2.2% annual risk of stroke.   S/p afib ablation 11/30/20, multiple atrial flutter circuits noted. Patient appears to be maintaining SR. Continue diltiazem 120 mg BID with 30 mg PRN for heart racing. Continue Eliquis 5 mg BID with no missed doses for at least 3 months post ablation.    Follow up with Dr Johney Frame as  scheduled.    Jorja Loa PA-C [facility] [address] [place], [place] [address] [phone] 12/28/2020 10:44 AM

## 2021-01-11 ENCOUNTER — Other Ambulatory Visit: Payer: Self-pay | Admitting: Internal Medicine

## 2021-01-16 ENCOUNTER — Telehealth: Payer: Self-pay | Admitting: Internal Medicine

## 2021-01-16 NOTE — Telephone Encounter (Signed)
Advised the patient that after 45 days the medication for post ablation is completed and she would not need/get refills. She thanked me for the call and was happy to hear she was done with the medication.   Removed pantoprazole from medication list.

## 2021-01-16 NOTE — Telephone Encounter (Signed)
Pt c/o medication issue:  1. Name of Medication: pantoprazole (PROTONIX) 40 MG tablet   2. How are you currently taking this medication (dosage and times per day)? As directed  3. Are you having a reaction (difficulty breathing--STAT)? no  4. What is your medication issue? Patient says the medication is affecting her stomach. She wanted to know if she could take Pepcid instead. Please advise

## 2021-01-19 ENCOUNTER — Ambulatory Visit: Payer: Medicare Other | Admitting: Internal Medicine

## 2021-02-28 ENCOUNTER — Ambulatory Visit: Payer: Medicare Other | Admitting: Internal Medicine

## 2021-02-28 ENCOUNTER — Other Ambulatory Visit: Payer: Self-pay

## 2021-02-28 ENCOUNTER — Ambulatory Visit (HOSPITAL_BASED_OUTPATIENT_CLINIC_OR_DEPARTMENT_OTHER): Payer: Medicare Other | Admitting: Internal Medicine

## 2021-02-28 VITALS — BP 126/64 | HR 152 | Ht 65.0 in | Wt 113.2 lb

## 2021-02-28 DIAGNOSIS — I4819 Other persistent atrial fibrillation: Secondary | ICD-10-CM | POA: Diagnosis not present

## 2021-02-28 NOTE — Patient Instructions (Addendum)
Medication Instructions:  Your physician recommends that you continue on your current medications as directed. Please refer to the Current Medication list given to you today. *If you need a refill on your cardiac medications before your next appointment, please call your pharmacy*  Lab Work: None. If you have labs (blood work) drawn today and your tests are completely normal, you will receive your results only by: MyChart Message (if you have MyChart) OR A paper copy in the mail If you have any lab test that is abnormal or we need to change your treatment, we will call you to review the results.  Testing/Procedures: None.  Follow-Up: At [facility], you and your health needs are our priority.  As part of our continuing mission to provide you with exceptional heart care, we have created designated Provider Care Teams.  These Care Teams include your primary Cardiologist (physician) and Advanced Practice Providers (APPs -  Physician Assistants and Nurse Practitioners) who all work together to provide you with the care you need, when you need it.  Your physician wants you to follow-up in: 1 week in the Afib Clinic. They will contact you to schedule. 05/11/21 at 10:45 with  Hillis Range, MD   We recommend signing up for the patient portal called "MyChart".  Sign up information is provided on this After Visit Summary.  MyChart is used to connect with patients for Virtual Visits (Telemedicine).  Patients are able to view lab/test results, encounter notes, upcoming appointments, etc.  Non-urgent messages can be sent to your provider as well.   To learn more about what you can do with MyChart, go to [email or web address].    Any Other Special Instructions Will Be Listed Below (If Applicable).

## 2021-02-28 NOTE — Progress Notes (Addendum)
PCP: Shirlean Mylar, MD Primary Cardiologist: Dr Shaaron Adler is a 70 y.o. female who presents today for routine electrophysiology followup.  Since his recent afib ablation, the patient reports doing very well.  she denies procedure related complications and is pleased with the results of the procedure.  She is in AF today but not aware. Today, she denies symptoms of palpitations, chest pain, shortness of breath,  lower extremity edema, dizziness, presyncope, or syncope.  The patient is otherwise without complaint today.   Past Medical History:  Diagnosis Date   A-fib (HCC)    Anxiety    Complication of anesthesia    Hypertension    Hypothyroidism    PONV (postoperative nausea and vomiting)    Thyroid disease    Past Surgical History:  Procedure Laterality Date   ABDOMINAL HYSTERECTOMY     ATRIAL FIBRILLATION ABLATION N/A 11/30/2020   Procedure: ATRIAL FIBRILLATION ABLATION;  Surgeon: Hillis Range, MD;  Location: [facility] INVASIVE CV LAB;  Service: Cardiovascular;  Laterality: N/A;   BUBBLE STUDY  11/29/2020   Procedure: BUBBLE STUDY;  Surgeon: Little Ishikawa, MD;  Location: [facility] ENDOSCOPY;  Service: Cardiovascular;;   CARDIOVERSION N/A 07/17/2020   Procedure: CARDIOVERSION;  Surgeon: Christell Constant, MD;  Location: [facility] ENDOSCOPY;  Service: Cardiovascular;  Laterality: N/A;   TEE WITHOUT CARDIOVERSION N/A 11/29/2020   Procedure: TRANSESOPHAGEAL ECHOCARDIOGRAM (TEE);  Surgeon: Little Ishikawa, MD;  Location[facility] ENDOSCOPY;  Service: Cardiovascular;  Laterality: N/A;  ABLATION    ROS- all systems are personally reviewed and negatives except as per HPI above  Current Outpatient Medications  Medication Sig Dispense Refill   apixaban (ELIQUIS) 5 MG TABS tablet Take 1 tablet (5 mg total) by mouth 2 (two) times daily. 60 tablet 11   diltiazem (CARDIZEM CD) 120 MG 24 hr capsule Take 1 capsule (120 mg total) by mouth 2 (two) times daily. 180 capsule 3   diltiazem  (CARDIZEM) 30 MG tablet TAKE 1 TABLET BY MOUTH EVERY 8 HOURS AS NEEDED 60 tablet 2   ibuprofen (ADVIL) 200 MG tablet Take 200 mg by mouth every 8 (eight) hours as needed for moderate pain.     levothyroxine (SYNTHROID) 88 MCG tablet Take 88 mcg by mouth daily before breakfast.     No current facility-administered medications for this visit.    Physical Exam: Vitals:   02/28/21 1042  BP: 126/64  Pulse: (!) 152  SpO2: 98%  Weight: 113 lb 3.2 oz (51.3 kg)  Height: 5\' 5"  (1.651 m)    GEN- The patient is well appearing, alert and oriented x 3 today.   Head- normocephalic, atraumatic Eyes-  Sclera clear, conjunctiva pink Ears- hearing intact Oropharynx- clear Lungs- Clear to ausculation bilaterally, normal work of breathing Heart- tachycardic irregular rhythm GI- soft, NT, ND, + BS Extremities- no clubbing, cyanosis, or edema  EKG tracing ordered today is personally reviewed and shows afib with RVR  Assessment and Plan:  1. Paroxysmal atrial fibrillation/ multifocal PACs Doing well s/p ablation chads2vasc score is 2.  She should continue eliquis She is in afib today but not aware.  I have offered AAD vs increased diltiazem.  She does not wish to change medicines currently.  She states "I feel well and do not tolerate medicines". She will return to AF clinic in 1 week.  If still in AF, will require [facility] to be arranged     Return to see me in 3 months  [facility] MD, [facility]  02/28/2021 10:47 AM

## 2021-03-07 ENCOUNTER — Other Ambulatory Visit: Payer: Self-pay

## 2021-03-07 ENCOUNTER — Ambulatory Visit (HOSPITAL_COMMUNITY)
Admission: RE | Admit: 2021-03-07 | Discharge: 2021-03-07 | Disposition: A | Discharge status: Home / Self Care | Payer: Medicare Other | Source: Ambulatory Visit | Attending: Physician Assistant | Admitting: Physician Assistant

## 2021-03-07 ENCOUNTER — Encounter (HOSPITAL_COMMUNITY): Payer: Self-pay | Admitting: Physician Assistant

## 2021-03-07 VITALS — BP 136/84 | HR 103 | Ht 65.0 in | Wt 112.8 lb

## 2021-03-07 DIAGNOSIS — Z8616 Personal history of COVID-19: Secondary | ICD-10-CM | POA: Insufficient documentation

## 2021-03-07 DIAGNOSIS — F17210 Nicotine dependence, cigarettes, uncomplicated: Secondary | ICD-10-CM | POA: Insufficient documentation

## 2021-03-07 DIAGNOSIS — Z79899 Other long term (current) drug therapy: Secondary | ICD-10-CM | POA: Insufficient documentation

## 2021-03-07 DIAGNOSIS — E039 Hypothyroidism, unspecified: Secondary | ICD-10-CM | POA: Diagnosis not present

## 2021-03-07 DIAGNOSIS — Z09 Encounter for follow-up examination after completed treatment for conditions other than malignant neoplasm: Secondary | ICD-10-CM | POA: Diagnosis not present

## 2021-03-07 DIAGNOSIS — I4892 Unspecified atrial flutter: Secondary | ICD-10-CM | POA: Diagnosis not present

## 2021-03-07 DIAGNOSIS — Z7901 Long term (current) use of anticoagulants: Secondary | ICD-10-CM | POA: Diagnosis not present

## 2021-03-07 DIAGNOSIS — I4819 Other persistent atrial fibrillation: Secondary | ICD-10-CM | POA: Insufficient documentation

## 2021-03-07 DIAGNOSIS — I10 Essential (primary) hypertension: Secondary | ICD-10-CM | POA: Insufficient documentation

## 2021-03-07 LAB — BASIC METABOLIC PANEL
Anion gap: 9 (ref 5–15)
BUN: 9 mg/dL (ref 8–23)
CO2: 27 mmol/L (ref 22–32)
Calcium: 9.2 mg/dL (ref 8.9–10.3)
Chloride: 99 mmol/L (ref 98–111)
Creatinine, Ser: 0.65 mg/dL (ref 0.44–1.00)
GFR, Estimated: >60 mL/min (ref >60)
Glucose, Bld: 82 mg/dL (ref 70–99)
Potassium: 3.9 mmol/L (ref 3.5–5.1)
Sodium: 135 mmol/L (ref 135–145)

## 2021-03-07 LAB — CBC
HCT: 43.3 % (ref 36.0–46.0)
Hemoglobin: 14.2 g/dL (ref 12.0–15.0)
MCH: 30.1 pg (ref 26.0–34.0)
MCHC: 32.8 g/dL (ref 30.0–36.0)
MCV: 91.9 fL (ref 80.0–100.0)
Platelets: 219 10*3/uL (ref 150–400)
RBC: 4.71 MIL/uL (ref 3.87–5.11)
RDW: 13.9 % (ref 11.5–15.5)
WBC: 5.1 10*3/uL (ref 4.0–10.5)
nRBC: 0 % (ref 0.0–0.2)

## 2021-03-07 MED ORDER — DILTIAZEM HCL ER COATED BEADS 180 MG PO CP24: 180.0000 mg | ORAL_CAPSULE | Freq: Two times a day (BID) | ORAL | 1 refills | Status: DC

## 2021-03-07 NOTE — Patient Instructions (Signed)
Increase cardizem to 180mg twice a day 

## 2021-03-07 NOTE — Progress Notes (Signed)
Primary Care Physician: Shirlean Mylar, MD (Inactive) Primary Cardiologist: Dr Anne Fu Primary Electrophysiologist: Dr Johney Frame Referring Physician: Manson Passey PA   Deanna Hamilton is a 70 y.o. female with a history of HTN, hypothyroidism, atrial flutter, and atrial fibrillation who presents for follow up in the [facility].  The patient was initially diagnosed with atrial fibrillation 05/2020 after presenting to the ED with symptoms of lightheadedness and weakness. ECG showed afib with RVR. Patient is on Eliquis for a CHADS2VASC score of 2. She underwent DCCV on 07/17/20 but unfortunately quickly reverted back to afib. She was started flecainide but developed symptoms of burning on her face and upper lip, presented to the ED. Flecainide was discontinued. Patient does report she was diagnosed with COVID around the time of her afib diagnosis. She denies significant snoring or alcohol use. Patient is s/p afib ablation on 11/30/20. She was found to be in afib with RVR at her visit with Dr Johney Frame 02/28/21.   On follow up today, patient reports that she occasionally feels her heart racing but otherwise feels well. ECG shows SR however on auscultation, she does have salvos of rapid rates. She denies any bleeding issues on anticoagulation.   Today, she denies symptoms of chest pain, shortness of breath, orthopnea, PND, lower extremity edema, dizziness, presyncope, syncope, snoring, daytime somnolence, bleeding, or neurologic sequela. The patient is tolerating medications without difficulties and is otherwise without complaint today.    Atrial Fibrillation Risk Factors:  she does not have symptoms or diagnosis of sleep apnea. she does not have a history of rheumatic fever. she does not have a history of alcohol use. The patient does not have a history of early familial atrial fibrillation or other arrhythmias.  she has a BMI of Body mass index is 18.77 kg/m.Marland Kitchen Filed  Weights   03/07/21 0911  Weight: 51.2 kg     No family history on file.   Atrial Fibrillation Management history:  Previous antiarrhythmic drugs: flecainide  Previous cardioversions: 07/17/20 Previous ablations: 11/30/20 CHADS2VASC score: 2 Anticoagulation history: Eliquis   Past Medical History:  Diagnosis Date   A-fib (HCC)    Anxiety    Complication of anesthesia    Hypertension    Hypothyroidism    PONV (postoperative nausea and vomiting)    Thyroid disease    Past Surgical History:  Procedure Laterality Date   ABDOMINAL HYSTERECTOMY     ATRIAL FIBRILLATION ABLATION N/A 11/30/2020   Procedure: ATRIAL FIBRILLATION ABLATION;  Surgeon: Hillis Range, MD;  Location: [facility] INVASIVE CV LAB;  Service: Cardiovascular;  Laterality: N/A;   BUBBLE STUDY  11/29/2020   Procedure: BUBBLE STUDY;  Surgeon: Little Ishikawa, MD;  Location: [facility] ENDOSCOPY;  Service: Cardiovascular;;   CARDIOVERSION N/A 07/17/2020   Procedure: CARDIOVERSION;  Surgeon: Christell Constant, MD;  Location: [facility] ENDOSCOPY;  Service: Cardiovascular;  Laterality: N/A;   TEE WITHOUT CARDIOVERSION N/A 11/29/2020   Procedure: TRANSESOPHAGEAL ECHOCARDIOGRAM (TEE);  Surgeon: Little Ishikawa, MD;  Location: [facility] ENDOSCOPY;  Service: Cardiovascular;  Laterality: N/A;  ABLATION    Current Outpatient Medications  Medication Sig Dispense Refill   apixaban (ELIQUIS) 5 MG TABS tablet Take 1 tablet (5 mg total) by mouth 2 (two) times daily. 60 tablet 11   diltiazem (CARDIZEM CD) 120 MG 24 hr capsule Take 1 capsule (120 mg total) by mouth 2 (two) times daily. 180 capsule 3   diltiazem (CARDIZEM) 30 MG tablet TAKE 1 TABLET BY MOUTH EVERY 8 HOURS AS  NEEDED 60 tablet 2   famotidine (PEPCID) 10 MG tablet Take 10 mg by mouth daily as needed for heartburn or indigestion.     ibuprofen (ADVIL) 200 MG tablet Take 200 mg by mouth every 8 (eight) hours as needed for moderate pain.     levothyroxine (SYNTHROID) 88 MCG tablet Take  88 mcg by mouth daily before breakfast.     No current facility-administered medications for this encounter.    Allergies  Allergen Reactions   Cephalexin Diarrhea   Erythromycin Nausea Only   Flecainide Swelling    Angioedema    Hydrocodone Nausea And Vomiting   Prednisone Other (See Comments)    Skin Crawling   Xanax [Alprazolam] Other (See Comments)    Skin Crawling   Doxycycline Calcium Rash    Social History   Socioeconomic History   Marital status: Widowed    Spouse name: Not on file   Number of children: Not on file   Years of education: Not on file   Highest education level: Not on file  Occupational History   Not on file  Tobacco Use   Smoking status: Every Day    Packs/day: 0.50    Types: Cigarettes   Smokeless tobacco: Never   Tobacco comments:    10 cigarettes daily 03/07/2021  Vaping Use   Vaping Use: Never used  Substance and Sexual Activity   Alcohol use: No   Drug use: No   Sexual activity: Not on file  Other Topics Concern   Not on file  Social History Narrative   Not on file   Social Determinants of Health   Financial Resource Strain: Not on file  Food Insecurity: Not on file  Transportation Needs: Not on file  Physical Activity: Not on file  Stress: Not on file  Social Connections: Not on file  Intimate Partner Violence: Not on file     ROS- All systems are reviewed and negative except as per the HPI above.  Physical Exam: Vitals:   03/07/21 0911  BP: 136/84  Pulse: (!) 103  Weight: 51.2 kg  Height: 5\' 5"  (1.651 m)    GEN- The patient is a well appearing female, alert and oriented x 3 today.   HEENT-head normocephalic, atraumatic, sclera clear, conjunctiva pink, hearing intact, trachea midline. Lungs- Clear to ausculation bilaterally, normal work of breathing Heart- Regular rate and rhythm, occasional rapid heart rate, no murmurs, rubs or gallops  GI- soft, NT, ND, + BS Extremities- no clubbing, cyanosis, or edema MS- no  significant deformity or atrophy Skin- no rash or lesion Psych- euthymic mood, full affect Neuro- strength and sensation are intact   Wt Readings from Last 3 Encounters:  03/07/21 51.2 kg  02/28/21 51.3 kg  12/28/20 50.3 kg    EKG today demonstrates  Sinus tachycardia Vent. rate 103 BPM PR interval 178 ms QRS duration 78 ms QT/QTcB 322/421 ms  Echo 06/07/20 demonstrated  1. Left ventricular ejection fraction, by estimation, is 55 to 60%. The  left ventricle has normal function. The left ventricle has no regional  wall motion abnormalities. Left ventricular diastolic parameters are  indeterminate.   2. Right ventricular systolic function is normal. The right ventricular  size is normal.   3. Left atrial size was mildly dilated.   4. Right atrial size was mildly dilated.   5. The mitral valve is normal in structure. Mild mitral valve  regurgitation. No evidence of mitral stenosis.   6. Tricuspid valve regurgitation is mild to  moderate.   7. The aortic valve was not well visualized. Aortic valve regurgitation  is mild. Mild to moderate aortic valve sclerosis/calcification is present,  without any evidence of aortic stenosis.   8. The inferior vena cava is normal in size with greater than 50%  respiratory variability, suggesting right atrial pressure of 3 mmHg.   Epic records are reviewed at length today  CHA2DS2-VASc Score = 2  The patient's score is based upon: CHF History: 0 HTN History: 0 Diabetes History: 0 Stroke History: 0 Vascular Disease History: 0 Age Score: 1 Gender Score: 1      ASSESSMENT AND PLAN: 1. Persistent Atrial Fibrillation/atrial flutter/atach The patient's CHA2DS2-VASc score is 2, indicating a 2.2% annual risk of stroke.   S/p afib ablation 11/30/20, multiple atrial flutter circuits noted. Patient back in SR but on physical exam today she appears to have frequent rapid rates which last only a few seconds. She can feel these episodes.  Will  increase diltiazem to 180 mg BID with 30 mg PRN for heart racing. If she continues to be symptomatic can consider cardiac monitor to evaluate burden and/or start AAD. She report many sensitivities to medications.  Continue Eliquis 5 mg BID  She is due to have her thyroid function checked with her PCP in a couple weeks.    Follow up with Dr Johney Frame as scheduled. Sooner in AF clinic if needed.    Jorja Loa PA-C [facility] [address] [place], [place] [address] [phone] 03/07/2021 9:27 AM

## 2021-03-13 ENCOUNTER — Telehealth (HOSPITAL_COMMUNITY): Payer: Self-pay | Admitting: *Deleted

## 2021-03-13 MED ORDER — DILTIAZEM HCL ER COATED BEADS 120 MG PO CP24: 120.0000 mg | ORAL_CAPSULE | Freq: Two times a day (BID) | ORAL | Status: DC

## 2021-03-13 NOTE — Telephone Encounter (Signed)
Patient called in stating she was unable to tolerate cardizem 180mg  BID stated it made her dizzy and wobbly.  Pt went back to 120mg  BID but had an episode of AF last night that lasted 2 hours she felt jittery during this episode.  Per PA recommended trying cardizem 120mg  in the AM and 180mg  in the PM but pt stated she is scared of the 180mg  so she will have to think about it. For now she will use PRN cardizem 30mg  tabs for breakthrough and continue cardizem 120mg  BID. Pt will call if burden increases and will further discuss AAD at that point.

## 2021-04-11 DIAGNOSIS — Z23 Encounter for immunization: Secondary | ICD-10-CM | POA: Diagnosis not present

## 2021-04-11 DIAGNOSIS — I4891 Unspecified atrial fibrillation: Secondary | ICD-10-CM | POA: Diagnosis not present

## 2021-04-11 DIAGNOSIS — E559 Vitamin D deficiency, unspecified: Secondary | ICD-10-CM | POA: Diagnosis not present

## 2021-04-11 DIAGNOSIS — Z1211 Encounter for screening for malignant neoplasm of colon: Secondary | ICD-10-CM | POA: Diagnosis not present

## 2021-04-11 DIAGNOSIS — E039 Hypothyroidism, unspecified: Secondary | ICD-10-CM | POA: Diagnosis not present

## 2021-04-11 DIAGNOSIS — Z0000 Encounter for general adult medical examination without abnormal findings: Secondary | ICD-10-CM | POA: Diagnosis not present

## 2021-04-11 DIAGNOSIS — Z79899 Other long term (current) drug therapy: Secondary | ICD-10-CM | POA: Diagnosis not present

## 2021-04-11 DIAGNOSIS — I071 Rheumatic tricuspid insufficiency: Secondary | ICD-10-CM | POA: Diagnosis not present

## 2021-04-11 DIAGNOSIS — Z1159 Encounter for screening for other viral diseases: Secondary | ICD-10-CM | POA: Diagnosis not present

## 2021-04-11 DIAGNOSIS — E785 Hyperlipidemia, unspecified: Secondary | ICD-10-CM | POA: Diagnosis not present

## 2021-04-11 DIAGNOSIS — J449 Chronic obstructive pulmonary disease, unspecified: Secondary | ICD-10-CM | POA: Diagnosis not present

## 2021-04-12 DIAGNOSIS — Z1211 Encounter for screening for malignant neoplasm of colon: Secondary | ICD-10-CM | POA: Diagnosis not present

## 2021-05-07 ENCOUNTER — Other Ambulatory Visit (HOSPITAL_COMMUNITY): Payer: Self-pay | Admitting: Physician Assistant

## 2021-05-11 ENCOUNTER — Encounter (HOSPITAL_BASED_OUTPATIENT_CLINIC_OR_DEPARTMENT_OTHER): Payer: Self-pay | Admitting: Internal Medicine

## 2021-05-11 ENCOUNTER — Other Ambulatory Visit: Payer: Self-pay

## 2021-05-11 ENCOUNTER — Ambulatory Visit (HOSPITAL_BASED_OUTPATIENT_CLINIC_OR_DEPARTMENT_OTHER): Payer: Medicare Other | Admitting: Internal Medicine

## 2021-05-11 VITALS — BP 146/74 | HR 114 | Ht 64.5 in | Wt 113.0 lb

## 2021-05-11 DIAGNOSIS — I4819 Other persistent atrial fibrillation: Secondary | ICD-10-CM

## 2021-05-11 NOTE — Progress Notes (Signed)
PCP: Shirlean Mylar, MD Primary Cardiologist: Dr Anne Fu Primary EP: Dr Johney Frame  Deanna Hamilton is a 70 y.o. female who presents today for routine electrophysiology followup.  Since last being seen in our clinic, the patient reports doing very well.  Today, she denies symptoms of palpitations, chest pain, shortness of breath,  lower extremity edema, dizziness, presyncope, or syncope.  The patient is otherwise without complaint today.   Past Medical History:  Diagnosis Date   A-fib (HCC)    Anxiety    Complication of anesthesia    Hypertension    Hypothyroidism    PONV (postoperative nausea and vomiting)    Thyroid disease    Past Surgical History:  Procedure Laterality Date   ABDOMINAL HYSTERECTOMY     ATRIAL FIBRILLATION ABLATION N/A 11/30/2020   Procedure: ATRIAL FIBRILLATION ABLATION;  Surgeon: Hillis Range, MD;  Location: [facility];  Service: Cardiovascular;  Laterality: N/A;   BUBBLE STUDY  11/29/2020   Procedure: BUBBLE STUDY;  Surgeon: Little Ishikawa, MD;  Location: [facility] ENDOSCOPY;  Service: Cardiovascular;;   CARDIOVERSION N/A 07/17/2020   Procedure: CARDIOVERSION;  Surgeon: Christell Constant, MD;  Location: [facility] ENDOSCOPY;  Service: Cardiovascular;  Laterality: N/A;   TEE WITHOUT CARDIOVERSION N/A 11/29/2020   Procedure: TRANSESOPHAGEAL ECHOCARDIOGRAM (TEE);  Surgeon: Little Ishikawa, MD;  Location: [facility] ENDOSCOPY;  Service: Cardiovascular;  Laterality: N/A;  ABLATION    ROS- all systems are reviewed and negatives except as per HPI above  Current Outpatient Medications  Medication Sig Dispense Refill   apixaban (ELIQUIS) 5 MG TABS tablet Take 1 tablet (5 mg total) by mouth 2 (two) times daily. 60 tablet 11   diltiazem (CARDIZEM CD) 120 MG 24 hr capsule Take 1 capsule (120 mg total) by mouth 2 (two) times daily. (Patient taking differently: Take 120 mg by mouth daily.)     diltiazem (CARDIZEM CD) 180 MG 24 hr capsule TAKE 1 CAPSULE BY MOUTH TWO TIMES A  DAY (Patient taking differently: daily.) 60 capsule 1   diltiazem (CARDIZEM) 30 MG tablet TAKE 1 TABLET BY MOUTH EVERY 8 HOURS AS NEEDED 60 tablet 2   famotidine (PEPCID) 10 MG tablet Take 10 mg by mouth daily as needed for heartburn or indigestion.     ibuprofen (ADVIL) 200 MG tablet Take 200 mg by mouth every 8 (eight) hours as needed for moderate pain.     levothyroxine (SYNTHROID) 88 MCG tablet Take 88 mcg by mouth daily before breakfast.     No current facility-administered medications for this visit.    Physical Exam: Vitals:   05/11/21 1050  BP: (!) 146/74  Pulse: (!) 114  SpO2: 97%  Weight: 113 lb (51.3 kg)  Height: 5' 4.5" (1.638 m)    GEN- The patient is well appearing, alert and oriented x 3 today.   Head- normocephalic, atraumatic Eyes-  Sclera clear, conjunctiva pink Ears- hearing intact Oropharynx- clear Lungs- Clear to ausculation bilaterally, normal work of breathing Heart- Regular rate and rhythm, no murmurs, rubs or gallops, PMI not laterally displaced GI- soft, NT, ND, + BS Extremities- no clubbing, cyanosis, or edema  Wt Readings from Last 3 Encounters:  05/11/21 113 lb (51.3 kg)  03/07/21 112 lb 12.8 oz (51.2 kg)  02/28/21 113 lb 3.2 oz (51.3 kg)    EKG tracing ordered today is personally reviewed and shows sinus with PACs  Assessment and Plan:  Paroxysmal atrial fibrillation/ PACs Doing well post ablation 7/22 Continue diltiazem She is not interested  in AADs currently but may consider repeat ablation if her symptoms worsen  Follow-up in AF clinic in 4 months  Hillis Range MD, [facility] 05/11/2021 11:07 AM

## 2021-05-11 NOTE — Patient Instructions (Addendum)
Medication Instructions:  Your physician recommends that you continue on your current medications as directed. Please refer to the Current Medication list given to you today. *If you need a refill on your cardiac medications before your next appointment, please call your pharmacy*  Lab Work: None. If you have labs (blood work) drawn today and your tests are completely normal, you will receive your results only by: MyChart Message (if you have MyChart) OR A paper copy in the mail If you have any lab test that is abnormal or we need to change your treatment, we will call you to review the results.  Testing/Procedures: None.  Follow-Up: At [facility], you and your health needs are our priority.  As part of our continuing mission to provide you with exceptional heart care, we have created designated Provider Care Teams.  These Care Teams include your primary Cardiologist (physician) and Advanced Practice Providers (APPs -  Physician Assistants and Nurse Practitioners) who all work together to provide you with the care you need, when you need it.  Your physician wants you to follow-up in: 4 months in the Afib Clinic. They will contact you to schedule.   We recommend signing up for the patient portal called "MyChart".  Sign up information is provided on this After Visit Summary.  MyChart is used to connect with patients for Virtual Visits (Telemedicine).  Patients are able to view lab/test results, encounter notes, upcoming appointments, etc.  Non-urgent messages can be sent to your provider as well.   To learn more about what you can do with MyChart, go to [email or web address].    Any Other Special Instructions Will Be Listed Below (If Applicable).

## 2021-05-16 IMAGING — CT CT HEAD W/O CM
4 series · 16 of 47 positions shown, 18 images · non-contrast
Comparison: None.

CLINICAL DATA: Dizziness, transient ischemic attack.

EXAM:
CT HEAD WITHOUT CONTRAST
TECHNIQUE: Contiguous axial images were obtained from the base of the skull
through the vertex without intravenous contrast.

[Series 3: head wo · axial · 0.42mm/px · z∈[-98,+2]mm · 7 of 28 slices shown, 9 images]
[im 4/28  brain]
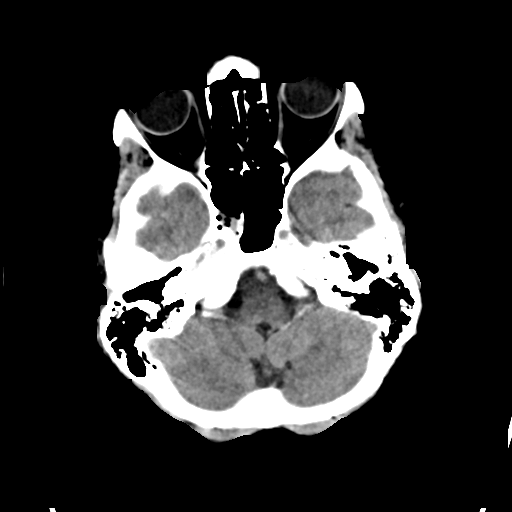
[im 4/28  bone]
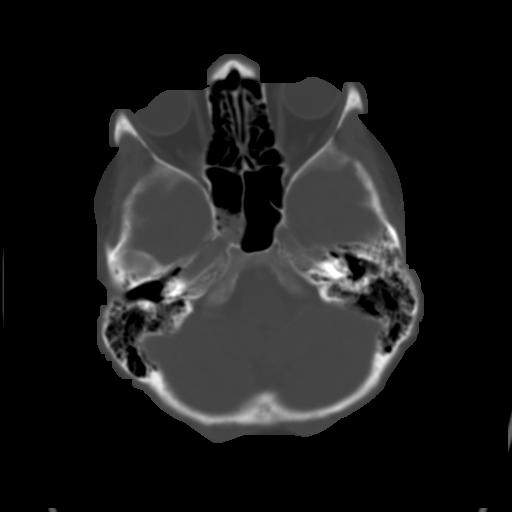
[im 7/28  brain]
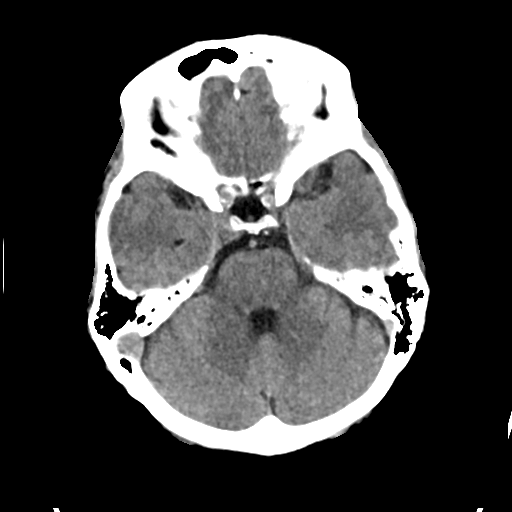
[im 11/28  brain]
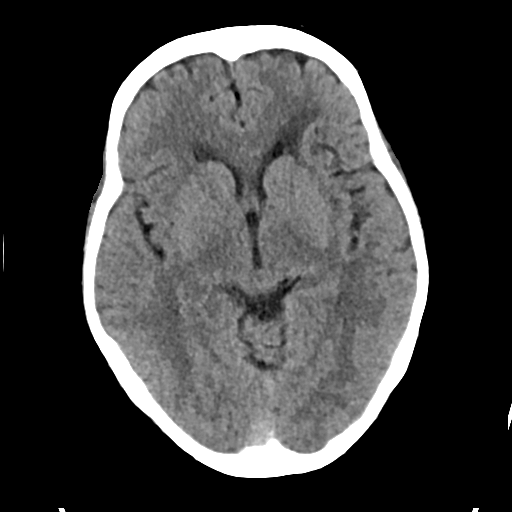
[im 14/28  brain]
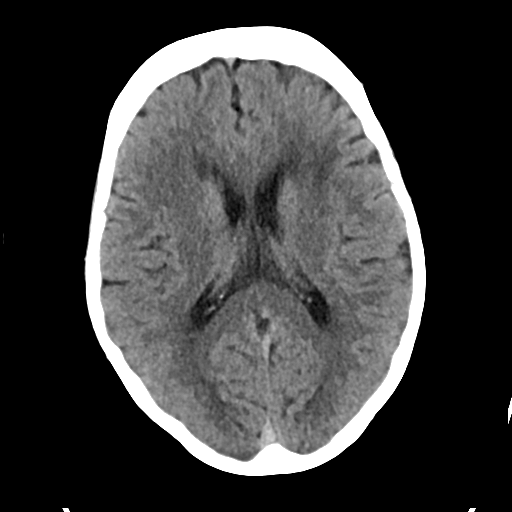
[im 17/28  brain]
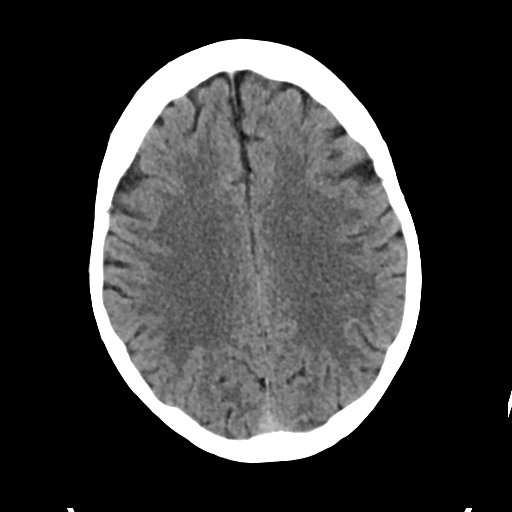
[im 17/28  bone]
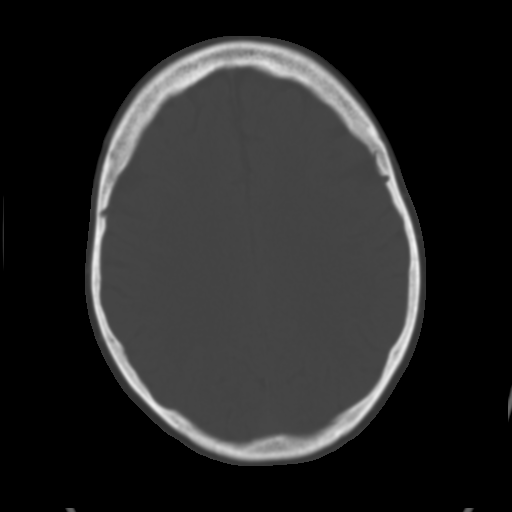
[im 21/28  brain]
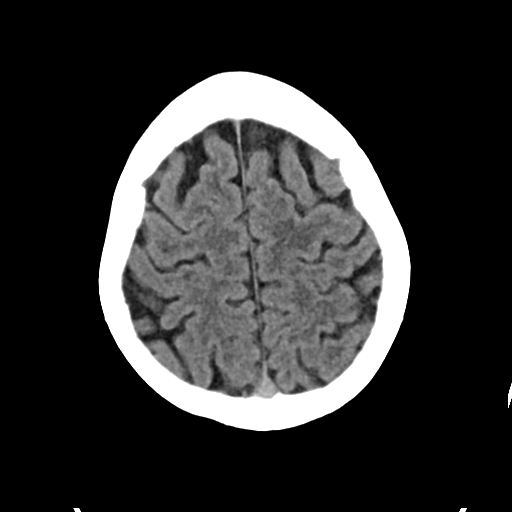
[im 24/28  brain]
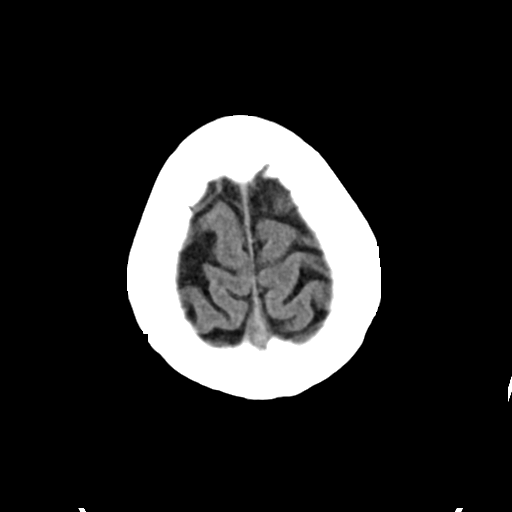

[Series 4: head bone · axial · 0.42mm/px · z∈[-100,-72]mm · 3 of 69 slices shown]
[im 7/69  bone]
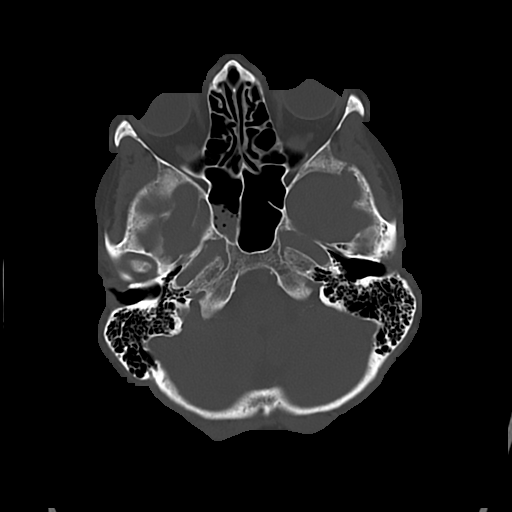
[im 14/69  bone]
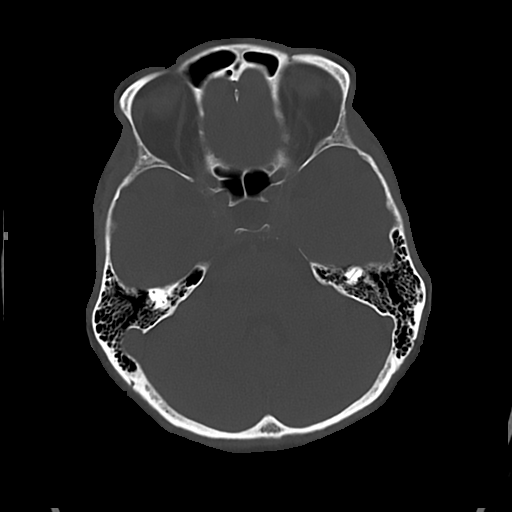
[im 21/69  bone]
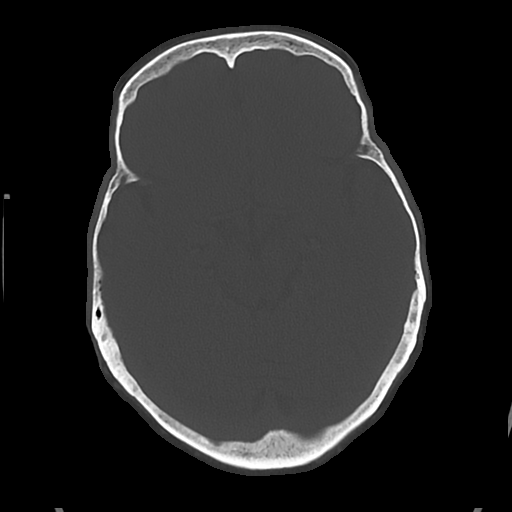

[Series 5: cor soft · coronal · 0.30mm/px · 3 of 66 slices shown]
[im 22/66  brain]
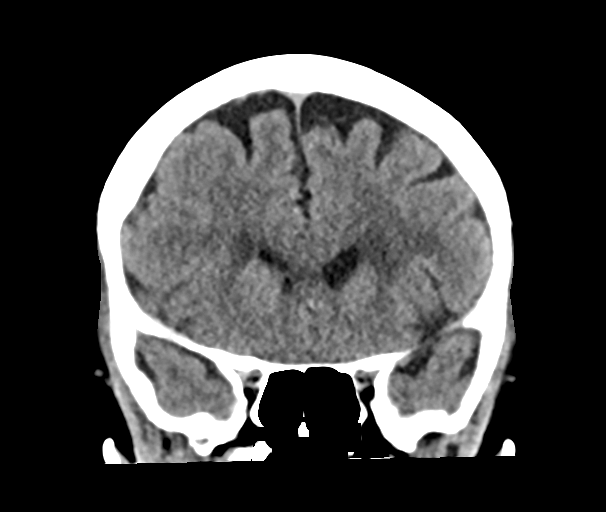
[im 29/66  brain]
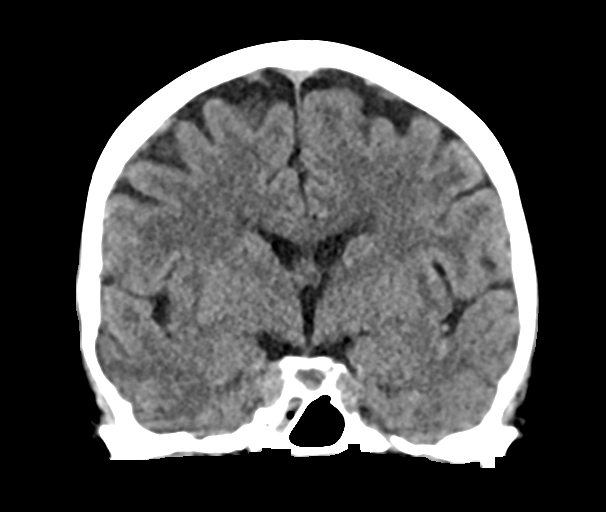
[im 37/66  brain]
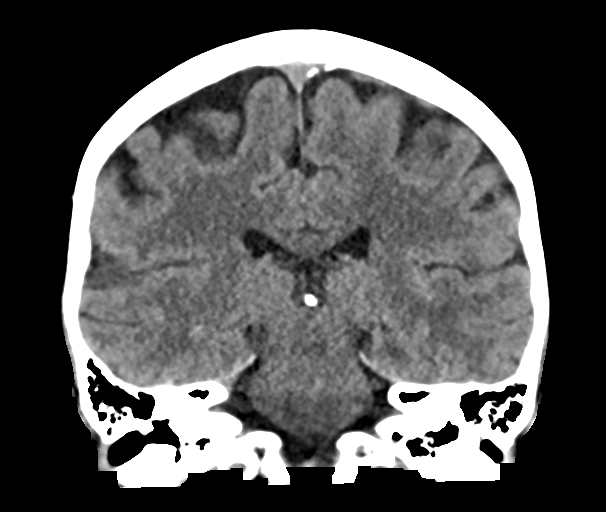

[Series 6: sag soft · sagittal · 0.31mm/px · 3 of 52 slices shown]
[im 18/52  brain]
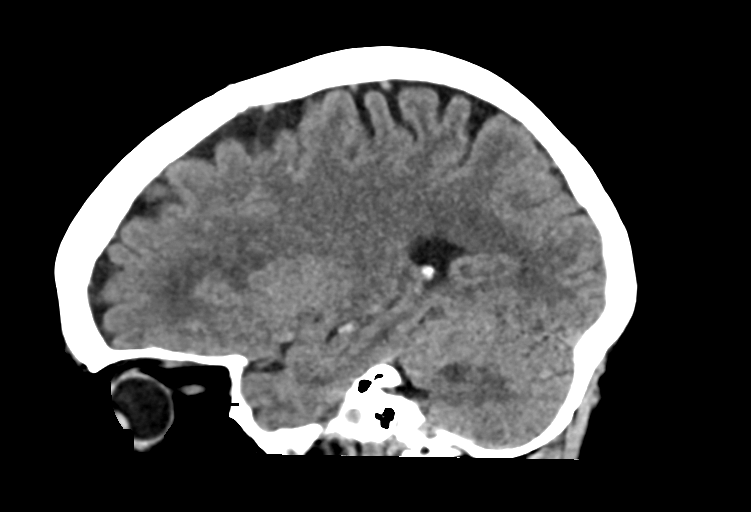
[im 26/52  brain]
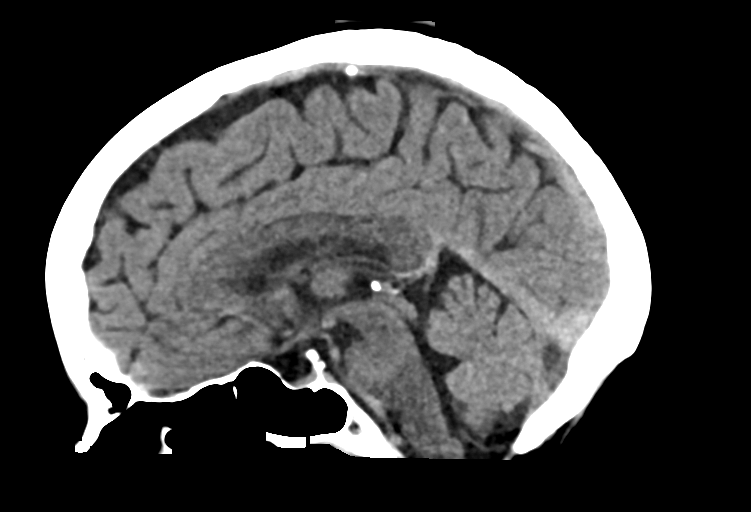
[im 35/52  brain]
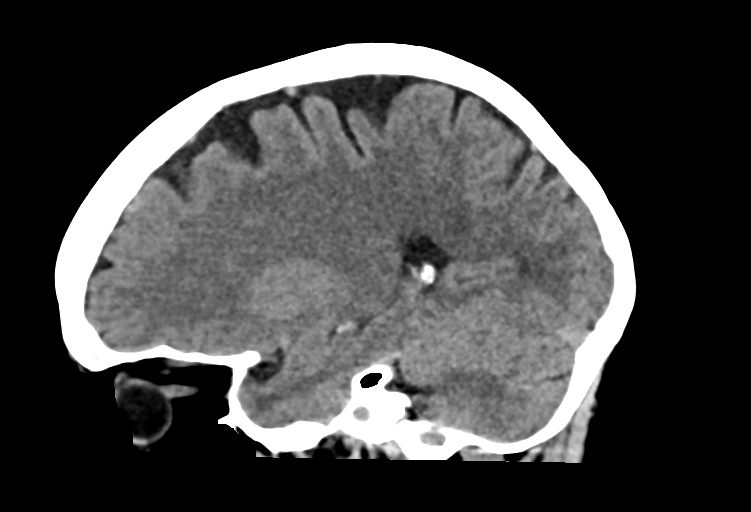

[16 of 47 positions shown; findings below may reference images not displayed]

FINDINGS: Brain: Mild chronic ischemic white matter disease is noted. No mass
effect or midline shift is noted. Ventricular size is within normal
limits. There is no evidence of mass lesion, hemorrhage or acute
infarction.

Vascular: No hyperdense vessel or unexpected calcification.

Skull: Normal. Negative for fracture or focal lesion.

Sinuses/Orbits: Mild right sphenoid sinusitis is noted.

Other: None.
IMPRESSION: Mild chronic ischemic white matter disease. Mild right sphenoid
sinusitis. No acute intracranial abnormality seen.

## 2021-05-16 IMAGING — DX DG CHEST 1V PORT
1 series · 1 of 1 positions shown · non-contrast
Comparison: February 22, 2017.

CLINICAL DATA: Dizziness.

EXAM:
PORTABLE CHEST 1 VIEW

[chest ap]
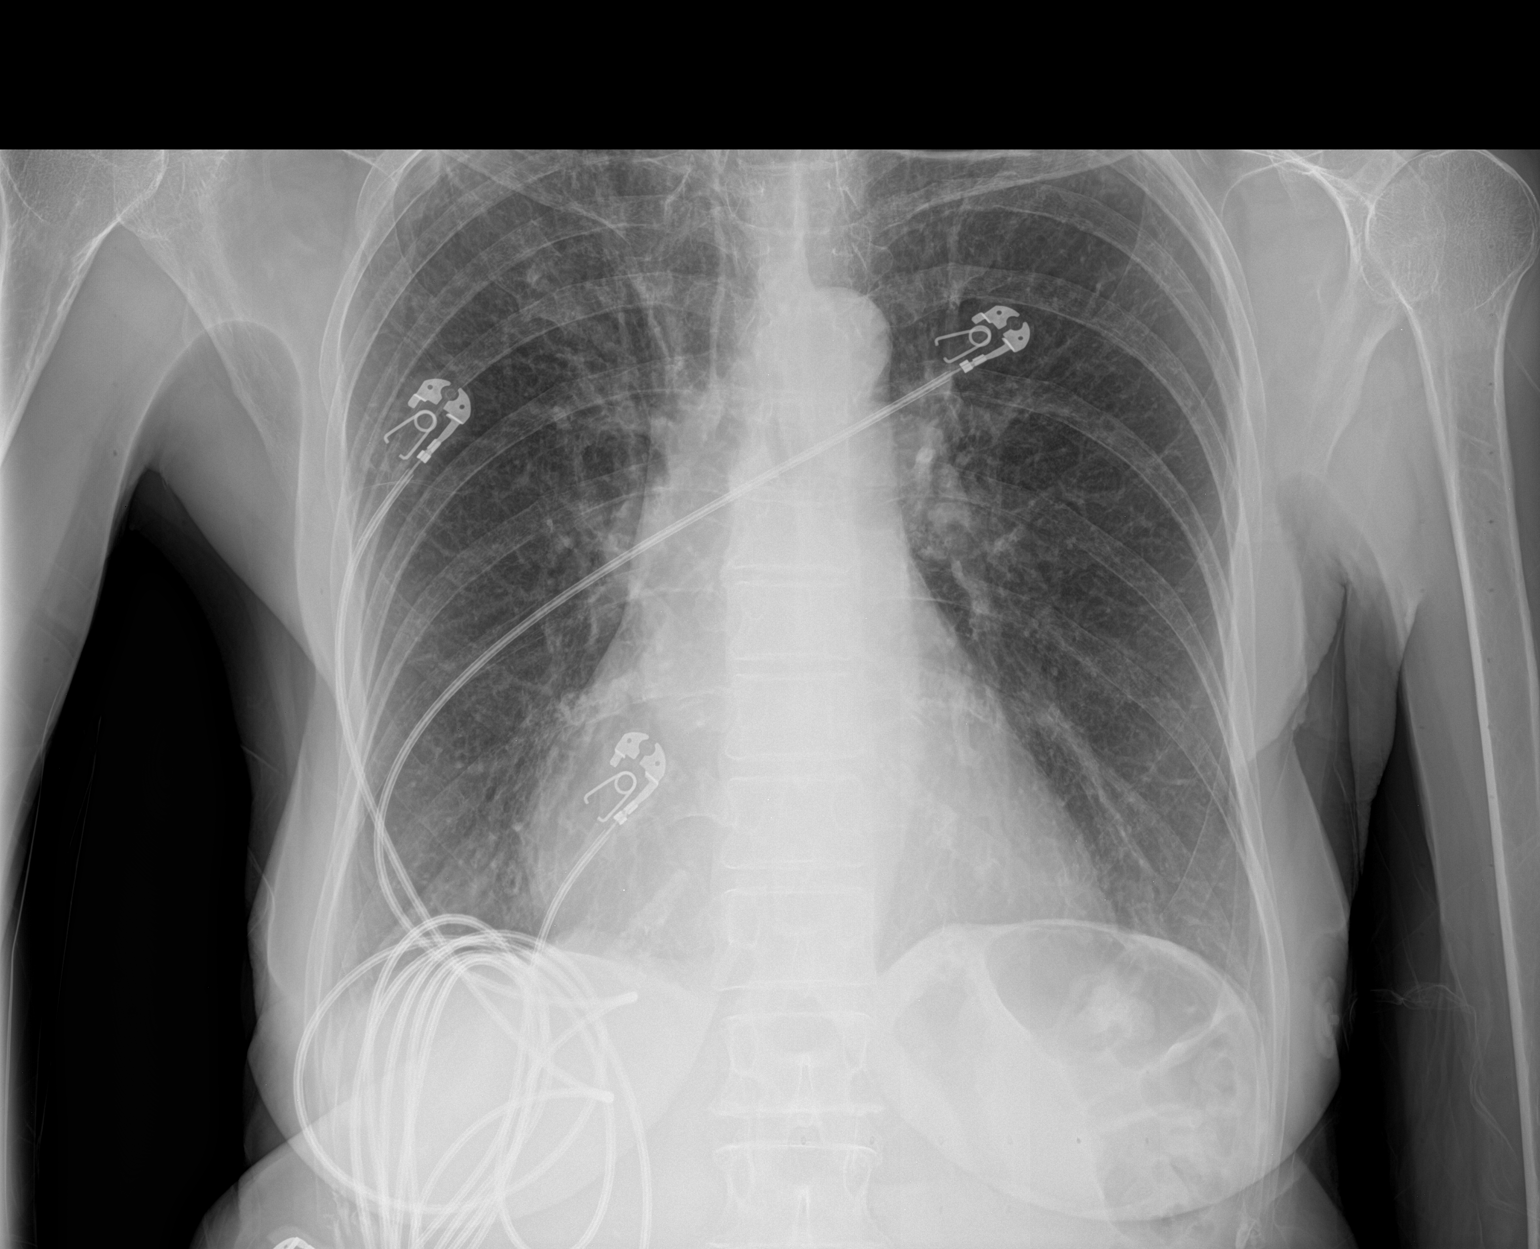

[1 of 1 positions shown; findings below may reference images not displayed]

FINDINGS: Stable cardiomediastinal silhouette. No pneumothorax or pleural
effusion is noted. Stable biapical scarring is noted. No acute
pulmonary disease is noted. Bony thorax is unremarkable.
IMPRESSION: No active disease.

## 2021-06-01 IMAGING — DX DG CHEST 1V PORT
1 series · 1 of 1 positions shown · non-contrast
Comparison: 06/07/2020.

CLINICAL DATA: Awoke this morning with heart palpitations. History
of atrial fibrillation.

EXAM:
PORTABLE CHEST 1 VIEW

[chest ap]
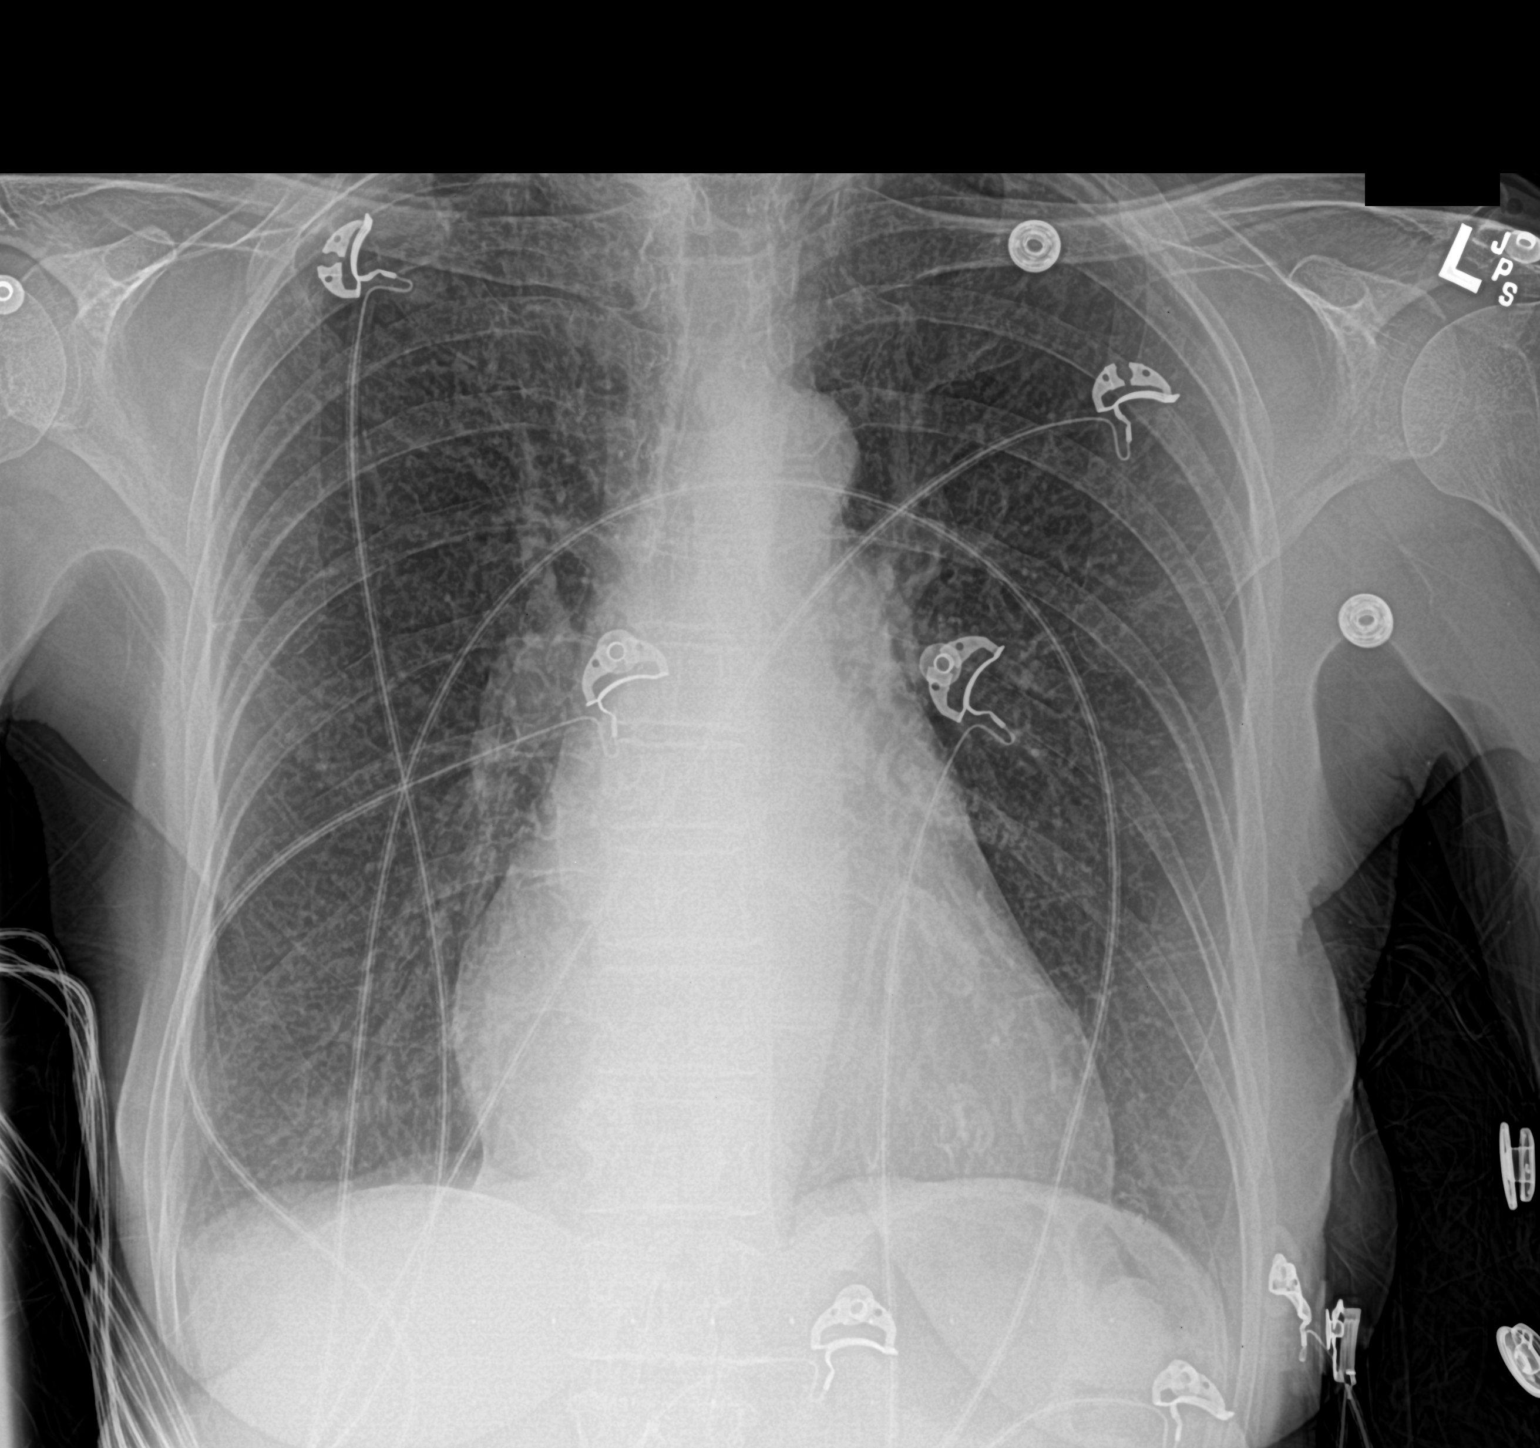

[1 of 1 positions shown; findings below may reference images not displayed]

FINDINGS: Cardiac silhouette is top-normal in size. No mediastinal or hilar
masses.

Lungs demonstrate prominent bronchovascular markings, and are
hyperexpanded. Mild stable scarring noted in both apices.

No convincing pleural effusion and no pneumothorax.

Skeletal structures are grossly intact.
IMPRESSION: 1. No acute cardiopulmonary disease.
2. Mild cardiomegaly. Lung hyperexpansion consistent with
COPD/emphysema.

## 2021-06-25 ENCOUNTER — Other Ambulatory Visit: Payer: Self-pay

## 2021-06-26 ENCOUNTER — Other Ambulatory Visit: Payer: Self-pay

## 2021-06-26 MED ORDER — DILTIAZEM HCL ER COATED BEADS 120 MG PO CP24: 120.0000 mg | ORAL_CAPSULE | Freq: Two times a day (BID) | ORAL | 3 refills | Status: DC

## 2021-06-28 ENCOUNTER — Other Ambulatory Visit: Payer: Self-pay | Admitting: *Deleted

## 2021-06-28 ENCOUNTER — Other Ambulatory Visit: Payer: Self-pay

## 2021-06-28 DIAGNOSIS — I4819 Other persistent atrial fibrillation: Secondary | ICD-10-CM

## 2021-06-28 MED ORDER — APIXABAN 5 MG PO TABS: 5.0000 mg | ORAL_TABLET | Freq: Two times a day (BID) | ORAL | 5 refills | Status: DC

## 2021-06-28 MED ORDER — DILTIAZEM HCL 30 MG PO TABS: 30.0000 mg | ORAL_TABLET | Freq: Three times a day (TID) | ORAL | 2 refills | Status: AC | PRN

## 2021-06-28 NOTE — Telephone Encounter (Signed)
Eliquis 5mg  paper refill request received. Patient is 71 years old, weight-51.3kg, Crea-0.65 on 03/07/2021, Diagnosis-Afib, and last seen by Dr. Rayann Heman on 05/11/2021. Dose is appropriate based on dosing criteria. Will send in refill to requested pharmacy.

## 2021-08-28 ENCOUNTER — Other Ambulatory Visit (HOSPITAL_COMMUNITY): Payer: Self-pay | Admitting: Physician Assistant

## 2021-09-20 ENCOUNTER — Encounter (HOSPITAL_COMMUNITY): Payer: Self-pay | Admitting: Physician Assistant

## 2021-09-20 ENCOUNTER — Ambulatory Visit (HOSPITAL_COMMUNITY)
Admission: RE | Admit: 2021-09-20 | Discharge: 2021-09-20 | Disposition: A | Discharge status: Home / Self Care | Payer: Medicare Other | Source: Ambulatory Visit | Attending: Physician Assistant | Admitting: Physician Assistant

## 2021-09-20 VITALS — BP 142/88 | HR 99 | Ht 64.5 in | Wt 111.0 lb

## 2021-09-20 DIAGNOSIS — Z8616 Personal history of COVID-19: Secondary | ICD-10-CM | POA: Insufficient documentation

## 2021-09-20 DIAGNOSIS — Z7901 Long term (current) use of anticoagulants: Secondary | ICD-10-CM | POA: Insufficient documentation

## 2021-09-20 DIAGNOSIS — E039 Hypothyroidism, unspecified: Secondary | ICD-10-CM | POA: Insufficient documentation

## 2021-09-20 DIAGNOSIS — I4819 Other persistent atrial fibrillation: Secondary | ICD-10-CM | POA: Diagnosis not present

## 2021-09-20 DIAGNOSIS — I10 Essential (primary) hypertension: Secondary | ICD-10-CM | POA: Insufficient documentation

## 2021-09-20 DIAGNOSIS — I4892 Unspecified atrial flutter: Secondary | ICD-10-CM | POA: Insufficient documentation

## 2021-09-20 DIAGNOSIS — F17210 Nicotine dependence, cigarettes, uncomplicated: Secondary | ICD-10-CM | POA: Insufficient documentation

## 2021-09-20 MED ORDER — DILTIAZEM HCL ER COATED BEADS 180 MG PO CP24: 180.0000 mg | ORAL_CAPSULE | Freq: Every day | ORAL | 1 refills | Status: DC

## 2021-09-20 MED ORDER — DILTIAZEM HCL ER COATED BEADS 120 MG PO CP24: 120.0000 mg | ORAL_CAPSULE | Freq: Every morning | ORAL | 1 refills | Status: DC

## 2021-09-20 NOTE — Progress Notes (Signed)
? ? ?Primary Care Physician: Shirlean Mylar, MD ?Primary Cardiologist: Dr Anne Fu ?Primary Electrophysiologist: Dr Johney Frame ?Referring Physician: Manson Passey PA ? ? ?Deanna Hamilton is a 71 y.o. female with a history of HTN, hypothyroidism, atrial flutter, and atrial fibrillation who presents for follow up in the [facility].  The patient was initially diagnosed with atrial fibrillation 05/2020 after presenting to the ED with symptoms of lightheadedness and weakness. ECG showed afib with RVR. Patient is on Eliquis for a CHADS2VASC score of 2. She underwent DCCV on 07/17/20 but unfortunately quickly reverted back to afib. She was started flecainide but developed symptoms of burning on her face and upper lip, presented to the ED. Flecainide was discontinued. Patient does report she was diagnosed with COVID around the time of her afib diagnosis. She denies significant snoring or alcohol use. Patient is s/p afib ablation on 11/30/20. She was found to be in afib with RVR at her visit with Dr Johney Frame 02/28/21.  ? ?On follow up today, patient reports she has done well since her last visit. She had two episodes of tachypalpitations lasting about 20 minutes. No bleeding issues on anticoagulation. She continues to smoke tobacco.  ? ?Today, she denies symptoms of palpitations, chest pain, shortness of breath, orthopnea, PND, lower extremity edema, dizziness, presyncope, syncope, snoring, daytime somnolence, bleeding, or neurologic sequela. The patient is tolerating medications without difficulties and is otherwise without complaint today.  ? ? ?Atrial Fibrillation Risk Factors: ? ?she does not have symptoms or diagnosis of sleep apnea. ?she does not have a history of rheumatic fever. ?she does not have a history of alcohol use. ?The patient does not have a history of early familial atrial fibrillation or other arrhythmias. ? ?she has a BMI of Body mass index is 18.76 kg/m?Marland KitchenMarland Kitchen ?Filed Weights  ? 09/20/21  1118  ?Weight: 50.3 kg  ? ? ? ?No family history on file. ? ? ?Atrial Fibrillation Management history: ? ?Previous antiarrhythmic drugs: flecainide  ?Previous cardioversions: 07/17/20 ?Previous ablations: 11/30/20 ?CHADS2VASC score: 2 ?Anticoagulation history: Eliquis ? ? ?Past Medical History:  ?Diagnosis Date  ? A-fib (HCC)   ? Anxiety   ? Complication of anesthesia   ? Hypertension   ? Hypothyroidism   ? PONV (postoperative nausea and vomiting)   ? Thyroid disease   ? ?Past Surgical History:  ?Procedure Laterality Date  ? ABDOMINAL HYSTERECTOMY    ? ATRIAL FIBRILLATION ABLATION N/A 11/30/2020  ? Procedure: ATRIAL FIBRILLATION ABLATION;  Surgeon: Hillis Range, MD;  Location: [facility];  Service: Cardiovascular;  Laterality: N/A;  ? BUBBLE STUDY  11/29/2020  ? Procedure: BUBBLE STUDY;  Surgeon: Little Ishikawa, MD;  Location: [facility] ENDOSCOPY;  Service: Cardiovascular;;  ? CARDIOVERSION N/A 07/17/2020  ? Procedure: CARDIOVERSION;  Surgeon: Christell Constant, MD;  Location: [facility] ENDOSCOPY;  Service: Cardiovascular;  Laterality: N/A;  ? TEE WITHOUT CARDIOVERSION N/A 11/29/2020  ? Procedure: TRANSESOPHAGEAL ECHOCARDIOGRAM (TEE);  Surgeon: Little Ishikawa, MD;  Location[facility] ENDOSCOPY;  Service: Cardiovascular;  Laterality: N/A;  ABLATION  ? ? ?Current Outpatient Medications  ?Medication Sig Dispense Refill  ? apixaban (ELIQUIS) 5 MG TABS tablet Take 1 tablet (5 mg total) by mouth 2 (two) times daily. 60 tablet 5  ? diltiazem (CARDIZEM CD) 120 MG 24 hr capsule Take 1 capsule (120 mg total) by mouth 2 (two) times daily. (Patient taking differently: Take 120 mg by mouth at bedtime.) 180 capsule 3  ? diltiazem (CARDIZEM CD) 180 MG 24 hr  capsule Take 1 capsule (180 mg total) by mouth at bedtime. 90 capsule 0  ? diltiazem (CARDIZEM) 30 MG tablet Take 1 tablet (30 mg total) by mouth every 8 (eight) hours as needed. 60 tablet 2  ? famotidine (PEPCID) 10 MG tablet Take 10 mg by mouth daily as needed for  heartburn or indigestion.    ? ibuprofen (ADVIL) 200 MG tablet Take 200 mg by mouth every 8 (eight) hours as needed for moderate pain.    ? levothyroxine (SYNTHROID) 88 MCG tablet Take 88 mcg by mouth daily before breakfast.    ? ?No current facility-administered medications for this encounter.  ? ? ?Allergies  ?Allergen Reactions  ? Cephalexin Diarrhea  ? Erythromycin Nausea Only  ? Flecainide Swelling  ?  Angioedema   ? Hydrocodone Nausea And Vomiting  ? Influenza Vaccines   ?  Swelling & redness, does not want high dose anymore  ? Prednisone Other (See Comments)  ?  Skin Crawling  ? Xanax [Alprazolam] Other (See Comments)  ?  Skin Crawling  ? Doxycycline Calcium Rash  ? ? ?Social History  ? ?Socioeconomic History  ? Marital status: Widowed  ?  Spouse name: Not on file  ? Number of children: Not on file  ? Years of education: Not on file  ? Highest education level: Not on file  ?Occupational History  ? Not on file  ?Tobacco Use  ? Smoking status: Every Day  ?  Packs/day: 0.50  ?  Types: Cigarettes  ? Smokeless tobacco: Never  ? Tobacco comments:  ?  12 cigarettes daily 03/07/2021  ?Vaping Use  ? Vaping Use: Never used  ?Substance and Sexual Activity  ? Alcohol use: No  ? Drug use: No  ? Sexual activity: Not on file  ?Other Topics Concern  ? Not on file  ?Social History Narrative  ? Not on file  ? ?Social Determinants of Health  ? ?Financial Resource Strain: Not on file  ?Food Insecurity: Not on file  ?Transportation Needs: Not on file  ?Physical Activity: Not on file  ?Stress: Not on file  ?Social Connections: Not on file  ?Intimate Partner Violence: Not on file  ? ? ? ?ROS- All systems are reviewed and negative except as per the HPI above. ? ?Physical Exam: ?Vitals:  ? 09/20/21 1118  ?BP: (!) 142/88  ?Pulse: 99  ?Weight: 50.3 kg  ?Height: 5' 4.5" (1.638 m)  ? ? ?GEN- The patient is a well appearing female, alert and oriented x 3 today.   ?HEENT-head normocephalic, atraumatic, sclera clear, conjunctiva pink,  hearing intact, trachea midline. ?Lungs- Clear to ausculation bilaterally, normal work of breathing ?Heart- Regular rate and rhythm, no murmurs, rubs or gallops  ?GI- soft, NT, ND, + BS ?Extremities- no clubbing, cyanosis, or edema ?MS- no significant deformity or atrophy ?Skin- no rash or lesion ?Psych- euthymic mood, full affect ?Neuro- strength and sensation are intact ? ? ?Wt Readings from Last 3 Encounters:  ?09/20/21 50.3 kg  ?05/11/21 51.3 kg  ?03/07/21 51.2 kg  ? ? ?EKG today demonstrates  ?SR ?Vent. rate 99 BPM ?PR interval 180 ms ?QRS duration 80 ms ?QT/QTcB 352/451 ms ? ?Echo 06/07/20 demonstrated  ?1. Left ventricular ejection fraction, by estimation, is 55 to 60%. The  ?left ventricle has normal function. The left ventricle has no regional  ?wall motion abnormalities. Left ventricular diastolic parameters are  ?indeterminate.  ? 2. Right ventricular systolic function is normal. The right ventricular  ?size is normal.  ?  3. Left atrial size was mildly dilated.  ? 4. Right atrial size was mildly dilated.  ? 5. The mitral valve is normal in structure. Mild mitral valve  ?regurgitation. No evidence of mitral stenosis.  ? 6. Tricuspid valve regurgitation is mild to moderate.  ? 7. The aortic valve was not well visualized. Aortic valve regurgitation  ?is mild. Mild to moderate aortic valve sclerosis/calcification is present,  ?without any evidence of aortic stenosis.  ? 8. The inferior vena cava is normal in size with greater than 50%  ?respiratory variability, suggesting right atrial pressure of 3 mmHg.  ? ?Epic records are reviewed at length today ? ?CHA2DS2-VASc Score = 2  ?The patient's score is based upon: ?CHF History: 0 ?HTN History: 0 ?Diabetes History: 0 ?Stroke History: 0 ?Vascular Disease History: 0 ?Age Score: 1 ?Gender Score: 1 ?    ? ? ? ?ASSESSMENT AND PLAN: ?1. Persistent Atrial Fibrillation/atrial flutter/atach ?The patient's CHA2DS2-VASc score is 2, indicating a 2.2% annual risk of stroke.    ?S/p afib ablation 11/30/20, multiple atrial flutter circuits noted. ?Patient appears to be maintaining SR.  ?Continue diltiazem 180 mg AM and 120 mg PM with 30 mg PRN for heart racing.  ?Continue Eliquis 5 mg BID  ?

## 2021-12-24 ENCOUNTER — Other Ambulatory Visit: Payer: Self-pay | Admitting: Internal Medicine

## 2021-12-24 DIAGNOSIS — I4819 Other persistent atrial fibrillation: Secondary | ICD-10-CM

## 2021-12-24 NOTE — Telephone Encounter (Signed)
Prescription refill request for Eliquis received. Indication: Afib  Last office visit: 09/20/21 Charlean Merl) Scr: 0.65 (03/07/21)  Age: 72 Weight: 50.3kg  Appropriate dose and refill sent to requested pharmacy.

## 2022-03-21 ENCOUNTER — Ambulatory Visit (HOSPITAL_COMMUNITY): Payer: Medicare Other | Admitting: Physician Assistant

## 2022-03-21 ENCOUNTER — Other Ambulatory Visit (HOSPITAL_COMMUNITY): Payer: Self-pay | Admitting: Physician Assistant

## 2022-04-16 DIAGNOSIS — E559 Vitamin D deficiency, unspecified: Secondary | ICD-10-CM | POA: Diagnosis not present

## 2022-04-16 DIAGNOSIS — E785 Hyperlipidemia, unspecified: Secondary | ICD-10-CM | POA: Diagnosis not present

## 2022-04-16 DIAGNOSIS — Z0000 Encounter for general adult medical examination without abnormal findings: Secondary | ICD-10-CM | POA: Diagnosis not present

## 2022-04-16 DIAGNOSIS — R7303 Prediabetes: Secondary | ICD-10-CM | POA: Diagnosis not present

## 2022-04-16 DIAGNOSIS — J40 Bronchitis, not specified as acute or chronic: Secondary | ICD-10-CM | POA: Diagnosis not present

## 2022-04-16 DIAGNOSIS — J449 Chronic obstructive pulmonary disease, unspecified: Secondary | ICD-10-CM | POA: Diagnosis not present

## 2022-04-16 DIAGNOSIS — I4819 Other persistent atrial fibrillation: Secondary | ICD-10-CM | POA: Diagnosis not present

## 2022-04-16 DIAGNOSIS — Z1211 Encounter for screening for malignant neoplasm of colon: Secondary | ICD-10-CM | POA: Diagnosis not present

## 2022-04-16 DIAGNOSIS — Z23 Encounter for immunization: Secondary | ICD-10-CM | POA: Diagnosis not present

## 2022-04-16 DIAGNOSIS — F17200 Nicotine dependence, unspecified, uncomplicated: Secondary | ICD-10-CM | POA: Diagnosis not present

## 2022-04-16 DIAGNOSIS — Z79899 Other long term (current) drug therapy: Secondary | ICD-10-CM | POA: Diagnosis not present

## 2022-04-16 DIAGNOSIS — E039 Hypothyroidism, unspecified: Secondary | ICD-10-CM | POA: Diagnosis not present

## 2022-04-17 DIAGNOSIS — Z79899 Other long term (current) drug therapy: Secondary | ICD-10-CM | POA: Diagnosis not present

## 2022-04-17 DIAGNOSIS — E785 Hyperlipidemia, unspecified: Secondary | ICD-10-CM | POA: Diagnosis not present

## 2022-04-17 DIAGNOSIS — E039 Hypothyroidism, unspecified: Secondary | ICD-10-CM | POA: Diagnosis not present

## 2022-04-17 DIAGNOSIS — R7303 Prediabetes: Secondary | ICD-10-CM | POA: Diagnosis not present

## 2022-04-21 ENCOUNTER — Other Ambulatory Visit (HOSPITAL_COMMUNITY): Payer: Self-pay | Admitting: Physician Assistant

## 2022-04-23 DIAGNOSIS — Z1211 Encounter for screening for malignant neoplasm of colon: Secondary | ICD-10-CM | POA: Diagnosis not present

## 2022-05-23 ENCOUNTER — Other Ambulatory Visit (HOSPITAL_COMMUNITY): Payer: Self-pay | Admitting: Physician Assistant

## 2022-06-24 ENCOUNTER — Other Ambulatory Visit: Payer: Self-pay | Admitting: *Deleted

## 2022-06-24 DIAGNOSIS — I4819 Other persistent atrial fibrillation: Secondary | ICD-10-CM

## 2022-06-24 MED ORDER — APIXABAN 5 MG PO TABS: 5.0000 mg | ORAL_TABLET | Freq: Two times a day (BID) | ORAL | 5 refills | Status: DC

## 2022-06-24 NOTE — Telephone Encounter (Signed)
Prescription refill request for Eliquis received. Indication:afib Last office visit:4/23 Scr:0.5 Age: 72 Weight:50.3  kg  Prescription refilled

## 2022-06-26 DIAGNOSIS — I4891 Unspecified atrial fibrillation: Secondary | ICD-10-CM | POA: Diagnosis not present

## 2022-06-26 DIAGNOSIS — E039 Hypothyroidism, unspecified: Secondary | ICD-10-CM | POA: Diagnosis not present

## 2022-06-26 DIAGNOSIS — E785 Hyperlipidemia, unspecified: Secondary | ICD-10-CM | POA: Diagnosis not present

## 2022-06-26 DIAGNOSIS — J449 Chronic obstructive pulmonary disease, unspecified: Secondary | ICD-10-CM | POA: Diagnosis not present

## 2022-09-23 ENCOUNTER — Ambulatory Visit (HOSPITAL_COMMUNITY)
Admission: RE | Admit: 2022-09-23 | Discharge: 2022-09-23 | Disposition: A | Discharge status: Home / Self Care | Payer: Medicare Other | Source: Ambulatory Visit | Attending: Physician Assistant | Admitting: Physician Assistant

## 2022-09-23 ENCOUNTER — Encounter (HOSPITAL_COMMUNITY): Payer: Self-pay | Admitting: Physician Assistant

## 2022-09-23 VITALS — BP 136/70 | HR 112 | Ht 64.5 in | Wt 104.4 lb

## 2022-09-23 DIAGNOSIS — Z79899 Other long term (current) drug therapy: Secondary | ICD-10-CM | POA: Diagnosis not present

## 2022-09-23 DIAGNOSIS — Z7901 Long term (current) use of anticoagulants: Secondary | ICD-10-CM | POA: Insufficient documentation

## 2022-09-23 DIAGNOSIS — I10 Essential (primary) hypertension: Secondary | ICD-10-CM | POA: Diagnosis not present

## 2022-09-23 DIAGNOSIS — Z8616 Personal history of COVID-19: Secondary | ICD-10-CM | POA: Diagnosis not present

## 2022-09-23 DIAGNOSIS — I4892 Unspecified atrial flutter: Secondary | ICD-10-CM | POA: Diagnosis not present

## 2022-09-23 DIAGNOSIS — I4819 Other persistent atrial fibrillation: Secondary | ICD-10-CM | POA: Diagnosis not present

## 2022-09-23 DIAGNOSIS — I082 Rheumatic disorders of both aortic and tricuspid valves: Secondary | ICD-10-CM | POA: Diagnosis not present

## 2022-09-23 DIAGNOSIS — I480 Paroxysmal atrial fibrillation: Secondary | ICD-10-CM

## 2022-09-23 DIAGNOSIS — E039 Hypothyroidism, unspecified: Secondary | ICD-10-CM | POA: Diagnosis not present

## 2022-09-23 LAB — BASIC METABOLIC PANEL
Anion gap: 11 (ref 5–15)
BUN: 15 mg/dL (ref 8–23)
CO2: 24 mmol/L (ref 22–32)
Calcium: 8.7 mg/dL — ABNORMAL LOW (ref 8.9–10.3)
Chloride: 98 mmol/L (ref 98–111)
Creatinine, Ser: 0.65 mg/dL (ref 0.44–1.00)
GFR, Estimated: >60 mL/min (ref >60)
Glucose, Bld: 94 mg/dL (ref 70–99)
Potassium: 3.9 mmol/L (ref 3.5–5.1)
Sodium: 133 mmol/L — ABNORMAL LOW (ref 135–145)

## 2022-09-23 LAB — CBC
HCT: 43.8 % (ref 36.0–46.0)
Hemoglobin: 14.4 g/dL (ref 12.0–15.0)
MCH: 30.3 pg (ref 26.0–34.0)
MCHC: 32.9 g/dL (ref 30.0–36.0)
MCV: 92.2 fL (ref 80.0–100.0)
Platelets: 207 10*3/uL (ref 150–400)
RBC: 4.75 MIL/uL (ref 3.87–5.11)
RDW: 14.6 % (ref 11.5–15.5)
WBC: 5.6 10*3/uL (ref 4.0–10.5)
nRBC: 0 % (ref 0.0–0.2)

## 2022-09-23 MED ORDER — DILTIAZEM HCL ER COATED BEADS 180 MG PO CP24: 180.0000 mg | ORAL_CAPSULE | Freq: Every day | ORAL | 1 refills | Status: DC

## 2022-09-23 MED ORDER — APIXABAN 5 MG PO TABS
5.0000 mg | ORAL_TABLET | Freq: Two times a day (BID) | ORAL | 5 refills | Status: DC
Start: 2022-09-23 — End: 2023-04-30

## 2022-09-23 MED ORDER — DILTIAZEM HCL ER COATED BEADS 120 MG PO CP24: 120.0000 mg | ORAL_CAPSULE | Freq: Every morning | ORAL | 1 refills | Status: DC

## 2022-09-23 NOTE — Progress Notes (Addendum)
Primary Care Physician: Shirlean Mylar, MD Primary Cardiologist: Dr Anne Fu Primary Electrophysiologist: Dr Johney Frame Referring Physician: Manson Passey PA   Deanna Hamilton is a 72 y.o. female with a history of HTN, hypothyroidism, atrial flutter, and atrial fibrillation who presents for follow up in the [facility].  The patient was initially diagnosed with atrial fibrillation 05/2020 after presenting to the ED with symptoms of lightheadedness and weakness. ECG showed afib with RVR. Patient is on Eliquis for a CHADS2VASC score of 2. She underwent DCCV on 07/17/20 but unfortunately quickly reverted back to afib. She was started flecainide but developed symptoms of burning on her face and upper lip, presented to the ED. Flecainide was discontinued. Patient does report she was diagnosed with COVID around the time of her afib diagnosis. She denies significant snoring or alcohol use. Patient is s/p afib ablation on 11/30/20.   On follow up today, patient reports that she has done very well up until today. She states that she became very stressed trying to find a parking spot for her visit today and wonders if this caused her to go out of rhythm. She is not really aware of her arrhythmia. No bleeding issues on anticoagulation.   Today, she denies symptoms of palpitations, chest pain, shortness of breath, orthopnea, PND, lower extremity edema, dizziness, presyncope, syncope, snoring, daytime somnolence, bleeding, or neurologic sequela. The patient is tolerating medications without difficulties and is otherwise without complaint today.    Atrial Fibrillation Risk Factors:  she does not have symptoms or diagnosis of sleep apnea. she does not have a history of rheumatic fever. she does not have a history of alcohol use. The patient does not have a history of early familial atrial fibrillation or other arrhythmias.  she has a BMI of Body mass index is 17.64 kg/m.Marland Kitchen Filed  Weights   09/23/22 1108  Weight: 47.4 kg   No family history on file.   Atrial Fibrillation Management history:  Previous antiarrhythmic drugs: flecainide  Previous cardioversions: 07/17/20 Previous ablations: 11/30/20 CHADS2VASC score: 2 Anticoagulation history: Eliquis   Past Medical History:  Diagnosis Date   A-fib (HCC)    Anxiety    Complication of anesthesia    Hypertension    Hypothyroidism    PONV (postoperative nausea and vomiting)    Thyroid disease    Past Surgical History:  Procedure Laterality Date   ABDOMINAL HYSTERECTOMY     ATRIAL FIBRILLATION ABLATION N/A 11/30/2020   Procedure: ATRIAL FIBRILLATION ABLATION;  Surgeon: Hillis Range, MD;  Location: [facility] INVASIVE CV LAB;  Service: Cardiovascular;  Laterality: N/A;   BUBBLE STUDY  11/29/2020   Procedure: BUBBLE STUDY;  Surgeon: Little Ishikawa, MD;  Location: [facility] ENDOSCOPY;  Service: Cardiovascular;;   CARDIOVERSION N/A 07/17/2020   Procedure: CARDIOVERSION;  Surgeon: Christell Constant, MD;  Location: [facility] ENDOSCOPY;  Service: Cardiovascular;  Laterality: N/A;   TEE WITHOUT CARDIOVERSION N/A 11/29/2020   Procedure: TRANSESOPHAGEAL ECHOCARDIOGRAM (TEE);  Surgeon: Little Ishikawa, MD;  Location: [facility] ENDOSCOPY;  Service: Cardiovascular;  Laterality: N/A;  ABLATION    Current Outpatient Medications  Medication Sig Dispense Refill   diltiazem (CARDIZEM) 30 MG tablet Take 1 tablet (30 mg total) by mouth every 8 (eight) hours as needed. 60 tablet 2   famotidine (PEPCID) 10 MG tablet Take 10 mg by mouth daily as needed for heartburn or indigestion.     ibuprofen (ADVIL) 200 MG tablet Take 200 mg by mouth every 8 (eight) hours as needed  for moderate pain.     levothyroxine (SYNTHROID) 88 MCG tablet Take 88 mcg by mouth daily before breakfast.     apixaban (ELIQUIS) 5 MG TABS tablet Take 1 tablet (5 mg total) by mouth 2 (two) times daily. 60 tablet 5   diltiazem (CARDIZEM CD) 120 MG 24 hr capsule Take 1 capsule  (120 mg total) by mouth every morning. 90 capsule 1   diltiazem (CARDIZEM CD) 180 MG 24 hr capsule Take 1 capsule (180 mg total) by mouth at bedtime. 90 capsule 1   No current facility-administered medications for this encounter.    Allergies  Allergen Reactions   Cephalexin Diarrhea   Erythromycin Nausea Only   Flecainide Swelling    Angioedema    Hydrocodone Nausea And Vomiting   Influenza Vaccines     Swelling & redness, does not want high dose anymore   Prednisone Other (See Comments)    Skin Crawling   Xanax [Alprazolam] Other (See Comments)    Skin Crawling   Doxycycline Calcium Rash    Social History   Socioeconomic History   Marital status: Widowed    Spouse name: Not on file   Number of children: Not on file   Years of education: Not on file   Highest education level: Not on file  Occupational History   Not on file  Tobacco Use   Smoking status: Every Day    Packs/day: .5    Types: Cigarettes   Smokeless tobacco: Never   Tobacco comments:    12 cigarettes daily 03/07/2021  Vaping Use   Vaping Use: Never used  Substance and Sexual Activity   Alcohol use: No   Drug use: No   Sexual activity: Not on file  Other Topics Concern   Not on file  Social History Narrative   Not on file   Social Determinants of Health   Financial Resource Strain: Not on file  Food Insecurity: Not on file  Transportation Needs: Not on file  Physical Activity: Not on file  Stress: Not on file  Social Connections: Not on file  Intimate Partner Violence: Not on file     ROS- All systems are reviewed and negative except as per the HPI above.  Physical Exam: Vitals:   09/23/22 1108  BP: 136/70  Pulse: (!) 112  Weight: 47.4 kg  Height: 5' 4.5" (1.638 m)    GEN- The patient is a well appearing elderly female, alert and oriented x 3 today.   HEENT-head normocephalic, atraumatic, sclera clear, conjunctiva pink, hearing intact, trachea midline. Lungs- Clear to ausculation  bilaterally, normal work of breathing Heart- irregular rate and rhythm, no murmurs, rubs or gallops  GI- soft, NT, ND, + BS Extremities- no clubbing, cyanosis, or edema MS- no significant deformity or atrophy Skin- no rash or lesion Psych- euthymic mood, full affect Neuro- strength and sensation are intact   Wt Readings from Last 3 Encounters:  09/23/22 47.4 kg  09/20/21 50.3 kg  05/11/21 51.3 kg    EKG today demonstrates  Coarse afib Vent. rate 112 BPM PR interval * ms QRS duration 78 ms QT/QTcB 322/439 ms  Echo 06/07/20 demonstrated  1. Left ventricular ejection fraction, by estimation, is 55 to 60%. The  left ventricle has normal function. The left ventricle has no regional  wall motion abnormalities. Left ventricular diastolic parameters are  indeterminate.   2. Right ventricular systolic function is normal. The right ventricular  size is normal.   3. Left atrial size was  mildly dilated.   4. Right atrial size was mildly dilated.   5. The mitral valve is normal in structure. Mild mitral valve  regurgitation. No evidence of mitral stenosis.   6. Tricuspid valve regurgitation is mild to moderate.   7. The aortic valve was not well visualized. Aortic valve regurgitation  is mild. Mild to moderate aortic valve sclerosis/calcification is present,  without any evidence of aortic stenosis.   8. The inferior vena cava is normal in size with greater than 50%  respiratory variability, suggesting right atrial pressure of 3 mmHg.   Epic records are reviewed at length today  CHA2DS2-VASc Score = 2  The patient's score is based upon: CHF History: 0 HTN History: 0 Diabetes History: 0 Stroke History: 0 Vascular Disease History: 0 Age Score: 1 Gender Score: 1        ASSESSMENT AND PLAN: 1. Persistent Atrial Fibrillation/atrial flutter/atach The patient's CHA2DS2-VASc score is 2, indicating a 2.2% annual risk of stroke.   S/p afib ablation 11/30/20, multiple atrial flutter  circuits noted. Patient is in afib today. She is unaware of her arrhythmia. We discussed rhythm control options. She is very hesitant to change her medications. We discussed Kardia mobile for home monitoring. She agrees to call the clinic if she becomes persistent.  Continue diltiazem 180 mg AM and 120 mg PM with 30 mg PRN for heart racing. Did not tolerate higher doses of diltiazem.  Continue Eliquis 5 mg BID   2. HTN Stable, no changes today.   Follow up in the AF clinic in 6 months, patient declined sooner follow up.    Jorja Loa PA-C Afib Clinic [facility] [address] [place], [place] [address] [phone] 09/23/2022 11:56 AM

## 2022-09-23 NOTE — Patient Instructions (Signed)
Kardia Mobile by [employer].

## 2022-11-06 DIAGNOSIS — J449 Chronic obstructive pulmonary disease, unspecified: Secondary | ICD-10-CM | POA: Diagnosis not present

## 2022-11-06 DIAGNOSIS — R7303 Prediabetes: Secondary | ICD-10-CM | POA: Diagnosis not present

## 2022-11-06 DIAGNOSIS — D6869 Other thrombophilia: Secondary | ICD-10-CM | POA: Diagnosis not present

## 2022-11-06 DIAGNOSIS — I4821 Permanent atrial fibrillation: Secondary | ICD-10-CM | POA: Diagnosis not present

## 2022-11-06 DIAGNOSIS — I2510 Atherosclerotic heart disease of native coronary artery without angina pectoris: Secondary | ICD-10-CM | POA: Diagnosis not present

## 2023-03-26 ENCOUNTER — Ambulatory Visit (HOSPITAL_COMMUNITY): Payer: Medicare Other | Admitting: Physician Assistant

## 2023-03-28 DIAGNOSIS — J441 Chronic obstructive pulmonary disease with (acute) exacerbation: Secondary | ICD-10-CM | POA: Diagnosis not present

## 2023-04-07 ENCOUNTER — Ambulatory Visit (HOSPITAL_COMMUNITY): Payer: Medicare Other | Admitting: Physician Assistant

## 2023-04-22 DIAGNOSIS — Z23 Encounter for immunization: Secondary | ICD-10-CM | POA: Diagnosis not present

## 2023-04-22 DIAGNOSIS — Z79899 Other long term (current) drug therapy: Secondary | ICD-10-CM | POA: Diagnosis not present

## 2023-04-22 DIAGNOSIS — E039 Hypothyroidism, unspecified: Secondary | ICD-10-CM | POA: Diagnosis not present

## 2023-04-22 DIAGNOSIS — E785 Hyperlipidemia, unspecified: Secondary | ICD-10-CM | POA: Diagnosis not present

## 2023-04-22 DIAGNOSIS — E559 Vitamin D deficiency, unspecified: Secondary | ICD-10-CM | POA: Diagnosis not present

## 2023-04-22 DIAGNOSIS — Z1211 Encounter for screening for malignant neoplasm of colon: Secondary | ICD-10-CM | POA: Diagnosis not present

## 2023-04-22 DIAGNOSIS — R7303 Prediabetes: Secondary | ICD-10-CM | POA: Diagnosis not present

## 2023-04-22 DIAGNOSIS — Z0000 Encounter for general adult medical examination without abnormal findings: Secondary | ICD-10-CM | POA: Diagnosis not present

## 2023-04-22 LAB — LAB REPORT - SCANNED: EGFR: 77

## 2023-04-23 ENCOUNTER — Other Ambulatory Visit (HOSPITAL_COMMUNITY): Payer: Self-pay | Admitting: Physician Assistant

## 2023-04-30 ENCOUNTER — Encounter (HOSPITAL_COMMUNITY): Payer: Self-pay | Admitting: Physician Assistant

## 2023-04-30 ENCOUNTER — Other Ambulatory Visit (HOSPITAL_COMMUNITY): Payer: Self-pay | Admitting: Physician Assistant

## 2023-04-30 ENCOUNTER — Inpatient Hospital Stay (HOSPITAL_COMMUNITY)
Admission: RE | Admit: 2023-04-30 | Discharge: 2023-04-30 | Disposition: A | Discharge status: Home / Self Care | Payer: Medicare Other | Source: Ambulatory Visit | Attending: Physician Assistant | Admitting: Physician Assistant

## 2023-04-30 ENCOUNTER — Ambulatory Visit (HOSPITAL_COMMUNITY)
Admission: RE | Admit: 2023-04-30 | Discharge: 2023-04-30 | Disposition: A | Discharge status: Home / Self Care | Payer: Medicare Other | Source: Ambulatory Visit | Attending: Physician Assistant | Admitting: Physician Assistant

## 2023-04-30 VITALS — BP 130/90 | HR 111 | Ht 64.5 in | Wt 106.0 lb

## 2023-04-30 DIAGNOSIS — I10 Essential (primary) hypertension: Secondary | ICD-10-CM | POA: Diagnosis not present

## 2023-04-30 DIAGNOSIS — Z1211 Encounter for screening for malignant neoplasm of colon: Secondary | ICD-10-CM | POA: Diagnosis not present

## 2023-04-30 DIAGNOSIS — I4819 Other persistent atrial fibrillation: Secondary | ICD-10-CM | POA: Diagnosis not present

## 2023-04-30 DIAGNOSIS — I4892 Unspecified atrial flutter: Secondary | ICD-10-CM | POA: Diagnosis not present

## 2023-04-30 DIAGNOSIS — E039 Hypothyroidism, unspecified: Secondary | ICD-10-CM | POA: Diagnosis not present

## 2023-04-30 DIAGNOSIS — I4891 Unspecified atrial fibrillation: Secondary | ICD-10-CM

## 2023-04-30 DIAGNOSIS — Z7901 Long term (current) use of anticoagulants: Secondary | ICD-10-CM | POA: Insufficient documentation

## 2023-04-30 MED ORDER — APIXABAN 5 MG PO TABS
5.0000 mg | ORAL_TABLET | Freq: Two times a day (BID) | ORAL | 5 refills | Status: DC
Start: 2023-04-30 — End: 2023-07-15

## 2023-04-30 MED ORDER — DILTIAZEM HCL ER COATED BEADS 180 MG PO CP24: 180.0000 mg | ORAL_CAPSULE | Freq: Every day | ORAL | 1 refills | Status: DC

## 2023-04-30 MED ORDER — DILTIAZEM HCL ER COATED BEADS 120 MG PO CP24: 120.0000 mg | ORAL_CAPSULE | Freq: Every morning | ORAL | 1 refills | Status: DC

## 2023-04-30 NOTE — Progress Notes (Signed)
Primary Care Physician: Shirlean Mylar, MD Primary Cardiologist: Dr Anne Fu Primary Electrophysiologist: Dr Johney Frame Referring Physician: Manson Passey PA   Deanna Hamilton is a 72 y.o. female with a history of HTN, hypothyroidism, atrial flutter, and atrial fibrillation who presents for follow up in the [facility].  The patient was initially diagnosed with atrial fibrillation 05/2020 after presenting to the ED with symptoms of lightheadedness and weakness. ECG showed afib with RVR. Patient is on Eliquis for a CHADS2VASC score of 2. She underwent DCCV on 07/17/20 but unfortunately quickly reverted back to afib. She was started flecainide but developed symptoms of burning on her face and upper lip, presented to the ED. Flecainide was discontinued. Patient does report she was diagnosed with COVID around the time of her afib diagnosis. She denies significant snoring or alcohol use. Patient is s/p afib ablation on 11/30/20.   On follow up today, patient reports that she has felt well since her last visit. She routinely checks her heart rate at home using a pulse oximeter which has shown 70s-80s bpm. She is in afib today with elevated rates. She is unaware of her arrhythmia.   Today, she denies symptoms of palpitations, chest pain, shortness of breath, orthopnea, PND, lower extremity edema, dizziness, presyncope, syncope, snoring, daytime somnolence, bleeding, or neurologic sequela. The patient is tolerating medications without difficulties and is otherwise without complaint today.    Atrial Fibrillation Risk Factors:  she does not have symptoms or diagnosis of sleep apnea. she does not have a history of rheumatic fever. she does not have a history of alcohol use. The patient does not have a history of early familial atrial fibrillation or other arrhythmias.   Atrial Fibrillation Management history:  Previous antiarrhythmic drugs: flecainide  Previous cardioversions:  07/17/20 Previous ablations: 11/30/20 Anticoagulation history: Eliquis   Past Medical History:  Diagnosis Date   A-fib (HCC)    Anxiety    Complication of anesthesia    Hypertension    Hypothyroidism    PONV (postoperative nausea and vomiting)    Thyroid disease     Current Outpatient Medications  Medication Sig Dispense Refill   diltiazem (CARDIZEM) 30 MG tablet Take 1 tablet (30 mg total) by mouth every 8 (eight) hours as needed. 60 tablet 2   famotidine (PEPCID) 10 MG tablet Take 10 mg by mouth daily as needed for heartburn or indigestion.     ibuprofen (ADVIL) 200 MG tablet Take 200 mg by mouth every 8 (eight) hours as needed for moderate pain.     levothyroxine (SYNTHROID) 88 MCG tablet Take 88 mcg by mouth daily before breakfast.     apixaban (ELIQUIS) 5 MG TABS tablet Take 1 tablet (5 mg total) by mouth 2 (two) times daily. 60 tablet 5   diltiazem (CARDIZEM CD) 120 MG 24 hr capsule Take 1 capsule (120 mg total) by mouth every morning. 90 capsule 1   diltiazem (CARDIZEM CD) 180 MG 24 hr capsule Take 1 capsule (180 mg total) by mouth at bedtime. 90 capsule 1   No current facility-administered medications for this encounter.    ROS- All systems are reviewed and negative except as per the HPI above.  Physical Exam: Vitals:   04/30/23 1020  BP: (!) 130/90  Pulse: (!) 111  Weight: 48.1 kg  Height: 5' 4.5" (1.638 m)     GEN: Well nourished, well developed in no acute distress NECK: No JVD; No carotid bruits CARDIAC: Irregularly irregular rate and rhythm,  no murmurs, rubs, gallops RESPIRATORY:  Clear to auscultation without rales, wheezing or rhonchi  ABDOMEN: Soft, non-tender, non-distended EXTREMITIES:  No edema; No deformity    Wt Readings from Last 3 Encounters:  04/30/23 48.1 kg  09/23/22 47.4 kg  09/20/21 50.3 kg    EKG today demonstrates  Afib Vent. rate 111 BPM PR interval * ms QRS duration 76 ms QT/QTcB 326/443 ms  Echo 06/07/20 demonstrated  1. Left  ventricular ejection fraction, by estimation, is 55 to 60%. The  left ventricle has normal function. The left ventricle has no regional  wall motion abnormalities. Left ventricular diastolic parameters are  indeterminate.   2. Right ventricular systolic function is normal. The right ventricular  size is normal.   3. Left atrial size was mildly dilated.   4. Right atrial size was mildly dilated.   5. The mitral valve is normal in structure. Mild mitral valve  regurgitation. No evidence of mitral stenosis.   6. Tricuspid valve regurgitation is mild to moderate.   7. The aortic valve was not well visualized. Aortic valve regurgitation  is mild. Mild to moderate aortic valve sclerosis/calcification is present,  without any evidence of aortic stenosis.   8. The inferior vena cava is normal in size with greater than 50%  respiratory variability, suggesting right atrial pressure of 3 mmHg.   Epic records are reviewed at length today  CHA2DS2-VASc Score = 2  The patient's score is based upon: CHF History: 0 HTN History: 0 Diabetes History: 0 Stroke History: 0 Vascular Disease History: 0 Age Score: 1 Gender Score: 1        ASSESSMENT AND PLAN: Persistent Atrial Fibrillation/atrial flutter/atach The patient's CHA2DS2-VASc score is 2, indicating a 2.2% annual risk of stroke.   S/p afib ablation 11/30/20, multiple atrial flutter circuits noted. Patient in afib today, unclear duration. Will have her wear a 2 week Zio monitor to evaluate arrhythmia burden. If persistent, can consider DCCV. Patient also agreeable to discussing repeat ablation with EP long term.  Continue diltiazem 180 mg AM and 120 mg PM with 30 mg PRN for heart racing. Did not tolerate higher doses of diltiazem.  Continue Eliquis 5 mg BID   HTN Stable on current regimen   Follow up in the AF clinic in 6 months. Sooner if high burden on monitor.    Jorja Loa PA-C Afib Clinic [facility] [address] [place], [place] [address] [phone] 04/30/2023 10:32 AM

## 2023-05-22 DIAGNOSIS — I4891 Unspecified atrial fibrillation: Secondary | ICD-10-CM | POA: Diagnosis not present

## 2023-05-22 NOTE — Addendum Note (Signed)
Encounter addended by: Shona Simpson, RN on: 05/22/2023 3:19 PM  Actions taken: Imaging Exam ended

## 2023-06-02 ENCOUNTER — Telehealth (HOSPITAL_COMMUNITY): Payer: Self-pay | Admitting: *Deleted

## 2023-06-02 NOTE — Telephone Encounter (Signed)
 Called patient regarding heart monitor only worn 7 hours. Pt states the monitor made me feel crazy being continously monitored and I had to take it off due to anxiety. Pt states once she took the monitor off she converted into normal rhythm and has remained there. She only goes into afib when she is stressed out such as with office visits etc. Encouraged pt to monitor her heart rhythms with kardia device since office visits and heart monitor have only captured afib. Pt to call if high afib burden seen on kardia device.

## 2023-07-15 ENCOUNTER — Other Ambulatory Visit (HOSPITAL_COMMUNITY): Payer: Self-pay

## 2023-07-15 DIAGNOSIS — I4819 Other persistent atrial fibrillation: Secondary | ICD-10-CM

## 2023-07-15 MED ORDER — APIXABAN 5 MG PO TABS
5.0000 mg | ORAL_TABLET | Freq: Two times a day (BID) | ORAL | Status: DC
Start: 2023-04-30 — End: 2023-11-05

## 2023-10-25 DIAGNOSIS — E039 Hypothyroidism, unspecified: Secondary | ICD-10-CM | POA: Diagnosis not present

## 2023-10-25 DIAGNOSIS — I4821 Permanent atrial fibrillation: Secondary | ICD-10-CM | POA: Diagnosis not present

## 2023-10-25 DIAGNOSIS — J449 Chronic obstructive pulmonary disease, unspecified: Secondary | ICD-10-CM | POA: Diagnosis not present

## 2023-10-25 DIAGNOSIS — J441 Chronic obstructive pulmonary disease with (acute) exacerbation: Secondary | ICD-10-CM | POA: Diagnosis not present

## 2023-11-05 ENCOUNTER — Encounter (HOSPITAL_COMMUNITY): Payer: Self-pay | Admitting: Physician Assistant

## 2023-11-05 ENCOUNTER — Ambulatory Visit (HOSPITAL_COMMUNITY)
Admission: RE | Admit: 2023-11-05 | Discharge: 2023-11-05 | Disposition: A | Discharge status: Home / Self Care | Payer: Medicare Other | Source: Ambulatory Visit | Attending: Physician Assistant | Admitting: Physician Assistant

## 2023-11-05 VITALS — BP 110/80 | HR 115 | Ht 64.5 in | Wt 103.2 lb

## 2023-11-05 DIAGNOSIS — I4819 Other persistent atrial fibrillation: Secondary | ICD-10-CM

## 2023-11-05 DIAGNOSIS — D6869 Other thrombophilia: Secondary | ICD-10-CM

## 2023-11-05 DIAGNOSIS — I480 Paroxysmal atrial fibrillation: Secondary | ICD-10-CM

## 2023-11-05 MED ORDER — DILTIAZEM HCL ER COATED BEADS 120 MG PO CP24: 120.0000 mg | ORAL_CAPSULE | Freq: Every morning | ORAL | 3 refills | Status: AC

## 2023-11-05 MED ORDER — DILTIAZEM HCL ER COATED BEADS 180 MG PO CP24: 180.0000 mg | ORAL_CAPSULE | Freq: Every day | ORAL | 3 refills | Status: AC

## 2023-11-05 MED ORDER — APIXABAN 5 MG PO TABS
5.0000 mg | ORAL_TABLET | Freq: Two times a day (BID) | ORAL | 3 refills | Status: AC
Start: 2023-11-05 — End: ?

## 2023-11-05 NOTE — Patient Instructions (Signed)
 Kardia Mobile by [employer]  1 lead

## 2023-11-05 NOTE — Progress Notes (Signed)
 Primary Care Physician: Sylvester Evert, MD Primary Cardiologist: Dr Renna Cary Primary Electrophysiologist: Dr Nunzio Belch Referring Physician: Narvis Bad PA   Deanna Hamilton is a 73 y.o. female with a history of HTN, hypothyroidism, atrial flutter, and atrial fibrillation who presents for follow up in the [facility].  The patient was initially diagnosed with atrial fibrillation 05/2020 after presenting to the ED with symptoms of lightheadedness and weakness. ECG showed afib with RVR. Patient is on Eliquis  for stroke prevention. She underwent DCCV on 07/17/20 but unfortunately quickly reverted back to afib. She was started flecainide  but developed symptoms of burning on her face and upper lip, presented to the ED. Flecainide  was discontinued. Patient does report she was diagnosed with COVID around the time of her afib diagnosis. She denies significant snoring or alcohol use. Patient is s/p afib ablation on 11/30/20.   Patient returns for follow up for atrial fibrillation. Patient is in afib today with elevated rates. She attempted to wear a cardiac monitor but had significant anxiety while wearing it and had to take it off. She has not yet purchased a Kardia mobile device. She states that she only goes into afib when she comes into the office. No bleeding issues on anticoagulation.   Today, she  denies symptoms of palpitations, chest pain, shortness of breath, orthopnea, PND, lower extremity edema, dizziness, presyncope, syncope, snoring, daytime somnolence, bleeding, or neurologic sequela. The patient is tolerating medications without difficulties and is otherwise without complaint today.    Atrial Fibrillation Risk Factors:  she does not have symptoms or diagnosis of sleep apnea. she does not have a history of rheumatic fever. she does not have a history of alcohol use. The patient does not have a history of early familial atrial fibrillation or other  arrhythmias.   Atrial Fibrillation Management history:  Previous antiarrhythmic drugs: flecainide   Previous cardioversions: 07/17/20 Previous ablations: 11/30/20 Anticoagulation history: Eliquis    Past Medical History:  Diagnosis Date   A-fib (HCC)    Anxiety    Complication of anesthesia    Hypertension    Hypothyroidism    PONV (postoperative nausea and vomiting)    Thyroid  disease     Current Outpatient Medications  Medication Sig Dispense Refill   diltiazem  (CARDIZEM ) 30 MG tablet Take 1 tablet (30 mg total) by mouth every 8 (eight) hours as needed. 60 tablet 2   famotidine (PEPCID) 10 MG tablet Take 10 mg by mouth daily as needed for heartburn or indigestion.     ibuprofen (ADVIL) 200 MG tablet Take 200 mg by mouth every 8 (eight) hours as needed for moderate pain.     levothyroxine (SYNTHROID) 88 MCG tablet Take 88 mcg by mouth daily before breakfast.     apixaban  (ELIQUIS ) 5 MG TABS tablet Take 1 tablet (5 mg total) by mouth 2 (two) times daily. 180 tablet 3   diltiazem  (CARDIZEM  CD) 120 MG 24 hr capsule Take 1 capsule (120 mg total) by mouth every morning. 90 capsule 3   diltiazem  (CARDIZEM  CD) 180 MG 24 hr capsule Take 1 capsule (180 mg total) by mouth at bedtime. 90 capsule 3   No current facility-administered medications for this encounter.    ROS- All systems are reviewed and negative except as per the HPI above.  Physical Exam: Vitals:   11/05/23 1024  BP: 110/80  Pulse: (!) 115  Weight: 46.8 kg  Height: 5' 4.5 (1.638 m)     GEN: Well nourished, well developed in  no acute distress CARDIAC: Irregularly irregular rate and rhythm, no murmurs, rubs, gallops RESPIRATORY:  Clear to auscultation without rales, wheezing or rhonchi  ABDOMEN: Soft, non-tender, non-distended EXTREMITIES:  No edema; No deformity    Wt Readings from Last 3 Encounters:  11/05/23 46.8 kg  04/30/23 48.1 kg  09/23/22 47.4 kg    EKG today demonstrates  Afib with RVR, PVC Vent.  rate 115 BPM PR interval * ms QRS duration 76 ms QT/QTcB 312/431 ms   Echo 06/07/20 demonstrated  1. Left ventricular ejection fraction, by estimation, is 55 to 60%. The  left ventricle has normal function. The left ventricle has no regional  wall motion abnormalities. Left ventricular diastolic parameters are  indeterminate.   2. Right ventricular systolic function is normal. The right ventricular  size is normal.   3. Left atrial size was mildly dilated.   4. Right atrial size was mildly dilated.   5. The mitral valve is normal in structure. Mild mitral valve  regurgitation. No evidence of mitral stenosis.   6. Tricuspid valve regurgitation is mild to moderate.   7. The aortic valve was not well visualized. Aortic valve regurgitation  is mild. Mild to moderate aortic valve sclerosis/calcification is present,  without any evidence of aortic stenosis.   8. The inferior vena cava is normal in size with greater than 50%  respiratory variability, suggesting right atrial pressure of 3 mmHg.   Epic records are reviewed at length today  CHA2DS2-VASc Score = 4  The patient's score is based upon: CHF History: 0 HTN History: 1 Diabetes History: 0 Stroke History: 0 Vascular Disease History: 0 Age Score: 2 Gender Score: 1       ASSESSMENT AND PLAN: Persistent Atrial Fibrillation/atrial flutter/atrial tachycardia The patient's CHA2DS2-VASc score is 4, indicating a 4.8% annual risk of stroke.   S/p afib ablation 11/30/20, multiple atrial flutter circuits noted Patient in afib today. She states that she has no symptoms of afib at home and feels that she is only in afib when she comes into the office. We discussed that it is possible to be in afib without significant symptoms. She did not tolerate wearing a cardiac monitor. She is very hesitant to make any medication changes. We discussed possible health consequences of untreated afib with elevated rates. She wants to purchase a Kardia mobile  device to assess her burden before making any changes.  Continue diltiazem  180 mg AM and 120 mg PM with 30 mg PRN for heart racing.  Continue Eliquis  5 mg BID  Secondary Hypercoagulable State (ICD10:  D68.69) The patient is at significant risk for stroke/thromboembolism based upon her CHA2DS2-VASc Score of 4.  Continue Apixaban  (Eliquis ). No bleeding issues.   HTN Stable on current regimen   Follow up in the AF clinic in one month.    Myrtha Ates PA-C Afib Clinic [facility] [address] [place], [place] [address] [phone] 11/05/2023 10:35 AM

## 2023-11-24 DIAGNOSIS — J449 Chronic obstructive pulmonary disease, unspecified: Secondary | ICD-10-CM | POA: Diagnosis not present

## 2023-11-24 DIAGNOSIS — E039 Hypothyroidism, unspecified: Secondary | ICD-10-CM | POA: Diagnosis not present

## 2023-11-24 DIAGNOSIS — I4821 Permanent atrial fibrillation: Secondary | ICD-10-CM | POA: Diagnosis not present

## 2023-11-24 DIAGNOSIS — J441 Chronic obstructive pulmonary disease with (acute) exacerbation: Secondary | ICD-10-CM | POA: Diagnosis not present

## 2023-12-05 ENCOUNTER — Ambulatory Visit (HOSPITAL_COMMUNITY)
Admission: RE | Admit: 2023-12-05 | Discharge: 2023-12-05 | Disposition: A | Discharge status: Home / Self Care | Source: Ambulatory Visit | Attending: Physician Assistant | Admitting: Physician Assistant

## 2023-12-05 ENCOUNTER — Other Ambulatory Visit (HOSPITAL_COMMUNITY): Payer: Self-pay

## 2023-12-05 ENCOUNTER — Encounter (HOSPITAL_COMMUNITY): Payer: Self-pay | Admitting: Physician Assistant

## 2023-12-05 VITALS — BP 138/86 | HR 113 | Ht 64.5 in | Wt 102.0 lb

## 2023-12-05 DIAGNOSIS — I480 Paroxysmal atrial fibrillation: Secondary | ICD-10-CM

## 2023-12-05 DIAGNOSIS — I4819 Other persistent atrial fibrillation: Secondary | ICD-10-CM

## 2023-12-05 DIAGNOSIS — I4891 Unspecified atrial fibrillation: Secondary | ICD-10-CM

## 2023-12-05 DIAGNOSIS — D6869 Other thrombophilia: Secondary | ICD-10-CM

## 2023-12-05 NOTE — Progress Notes (Signed)
 Primary Care Physician: Douglass Ivanoff, MD Primary Cardiologist: Dr Jeffrie Primary Electrophysiologist: Dr Kelsie (formerly) Referring Physician: Aleene Jernigan PA   Deanna Hamilton is a 73 y.o. female with a history of HTN, hypothyroidism, atrial flutter, and atrial fibrillation who presents for follow up in the [facility].  The patient was initially diagnosed with atrial fibrillation 05/2020 after presenting to the ED with symptoms of lightheadedness and weakness. ECG showed afib with RVR. Patient is on Eliquis  for stroke prevention. She underwent DCCV on 07/17/20 but unfortunately quickly reverted back to afib. She was started flecainide  but developed symptoms of burning on her face and upper lip, presented to the ED. Flecainide  was discontinued. Patient does report she was diagnosed with COVID around the time of her afib diagnosis. She denies significant snoring or alcohol use. Patient is s/p afib ablation on 11/30/20.   Patient returns for follow up for atrial fibrillation. She reports that she feels well and is unaware of her afib. She purchased a Kardia mobile which shows controlled V rates. No bleeding issues on anticoagulation.   Today, she  denies symptoms of palpitations, chest pain, shortness of breath, orthopnea, PND, lower extremity edema, dizziness, presyncope, syncope, snoring, daytime somnolence, bleeding, or neurologic sequela. The patient is tolerating medications without difficulties and is otherwise without complaint today.    Atrial Fibrillation Risk Factors:  she does not have symptoms or diagnosis of sleep apnea. she does not have a history of rheumatic fever. she does not have a history of alcohol use. The patient does not have a history of early familial atrial fibrillation or other arrhythmias.   Atrial Fibrillation Management history:  Previous antiarrhythmic drugs: flecainide   Previous cardioversions: 07/17/20 Previous ablations:  11/30/20 Anticoagulation history: Eliquis    Past Medical History:  Diagnosis Date   A-fib (HCC)    Anxiety    Complication of anesthesia    Hypertension    Hypothyroidism    PONV (postoperative nausea and vomiting)    Thyroid  disease     Current Outpatient Medications  Medication Sig Dispense Refill   apixaban  (ELIQUIS ) 5 MG TABS tablet Take 1 tablet (5 mg total) by mouth 2 (two) times daily. 180 tablet 3   diltiazem  (CARDIZEM  CD) 120 MG 24 hr capsule Take 1 capsule (120 mg total) by mouth every morning. 90 capsule 3   diltiazem  (CARDIZEM  CD) 180 MG 24 hr capsule Take 1 capsule (180 mg total) by mouth at bedtime. 90 capsule 3   diltiazem  (CARDIZEM ) 30 MG tablet Take 1 tablet (30 mg total) by mouth every 8 (eight) hours as needed. 60 tablet 2   famotidine (PEPCID) 10 MG tablet Take 10 mg by mouth daily as needed for heartburn or indigestion.     ibuprofen (ADVIL) 200 MG tablet Take 200 mg by mouth every 8 (eight) hours as needed for moderate pain.     levothyroxine (SYNTHROID) 88 MCG tablet Take 88 mcg by mouth daily before breakfast.     No current facility-administered medications for this encounter.    ROS- All systems are reviewed and negative except as per the HPI above.  Physical Exam: Vitals:   12/05/23 1018  BP: 138/86  Pulse: (!) 113  Weight: 46.3 kg  Height: 5' 4.5 (1.638 m)    GEN: Well nourished, well developed in no acute distress CARDIAC: Irregularly irregular rate and rhythm, no murmurs, rubs, gallops RESPIRATORY:  Clear to auscultation without rales, wheezing or rhonchi  ABDOMEN: Soft, non-tender, non-distended EXTREMITIES:  No edema; No deformity    Wt Readings from Last 3 Encounters:  12/05/23 46.3 kg  11/05/23 46.8 kg  04/30/23 48.1 kg    EKG today demonstrates  Afib Vent. rate 113 BPM PR interval * ms QRS duration 78 ms QT/QTcB 322/441 ms   Echo 06/07/20 demonstrated  1. Left ventricular ejection fraction, by estimation, is 55 to 60%. The   left ventricle has normal function. The left ventricle has no regional  wall motion abnormalities. Left ventricular diastolic parameters are  indeterminate.   2. Right ventricular systolic function is normal. The right ventricular  size is normal.   3. Left atrial size was mildly dilated.   4. Right atrial size was mildly dilated.   5. The mitral valve is normal in structure. Mild mitral valve  regurgitation. No evidence of mitral stenosis.   6. Tricuspid valve regurgitation is mild to moderate.   7. The aortic valve was not well visualized. Aortic valve regurgitation  is mild. Mild to moderate aortic valve sclerosis/calcification is present,  without any evidence of aortic stenosis.   8. The inferior vena cava is normal in size with greater than 50%  respiratory variability, suggesting right atrial pressure of 3 mmHg.   Epic records are reviewed at length today  CHA2DS2-VASc Score = 4  The patient's score is based upon: CHF History: 0 HTN History: 1 Diabetes History: 0 Stroke History: 0 Vascular Disease History: 0 Age Score: 2 Gender Score: 1       ASSESSMENT AND PLAN: Persistent Atrial Fibrillation/atrial flutter/atrial tach (ICD10:  I48.19) The patient's CHA2DS2-VASc score is 4, indicating a 4.8% annual risk of stroke.   S/p afib ablation 11/30/20, multiple atrial flutter circuits noted She remains in afib, likely longstanding at this point. Her Kardia mobile shows good rate control at home. We discussed rate vs rhythm control today. She is very hesitant to use any AAD medication. I suspect DCCV alone would not keep her in SR given the length of time she has been in afib. She is agreeable to discussing repeat ablation with EP. If she is not felt to be a good ablation candidate, she will likely opt for rate control only. Check echocardiogram Continue diltiazem  120 mg AM and 180 mg PM Continue Eliquis  5 mg BID Kardia mobile for home monitoring.   Secondary Hypercoagulable  State (ICD10:  D68.69) The patient is at significant risk for stroke/thromboembolism based upon her CHA2DS2-VASc Score of 4.  Continue Apixaban  (Eliquis ). No bleeding issues.   HTN Stable on current regimen   Follow up with EP to establish care and discuss repeat ablation (former [facility] patient).    Daril Kicks PA-C [facility] [address] [place], [place] [address] [phone] 12/05/2023 10:31 AM

## 2023-12-25 DIAGNOSIS — J449 Chronic obstructive pulmonary disease, unspecified: Secondary | ICD-10-CM | POA: Diagnosis not present

## 2023-12-25 DIAGNOSIS — E039 Hypothyroidism, unspecified: Secondary | ICD-10-CM | POA: Diagnosis not present

## 2023-12-25 DIAGNOSIS — J441 Chronic obstructive pulmonary disease with (acute) exacerbation: Secondary | ICD-10-CM | POA: Diagnosis not present

## 2023-12-25 DIAGNOSIS — I4821 Permanent atrial fibrillation: Secondary | ICD-10-CM | POA: Diagnosis not present

## 2024-01-16 ENCOUNTER — Ambulatory Visit (HOSPITAL_COMMUNITY)
Admission: RE | Admit: 2024-01-16 | Discharge: 2024-01-16 | Disposition: A | Discharge status: Home / Self Care | Source: Ambulatory Visit | Attending: Physician Assistant | Admitting: Physician Assistant

## 2024-01-16 DIAGNOSIS — I4891 Unspecified atrial fibrillation: Secondary | ICD-10-CM | POA: Insufficient documentation

## 2024-01-16 LAB — ECHOCARDIOGRAM COMPLETE
Area-P 1/2: 3.43 cm2
P 1/2 time: 318 ms
S' Lateral: 3.3 cm

## 2024-01-19 ENCOUNTER — Ambulatory Visit (HOSPITAL_COMMUNITY): Payer: Self-pay | Admitting: Physician Assistant

## 2024-01-25 DIAGNOSIS — J441 Chronic obstructive pulmonary disease with (acute) exacerbation: Secondary | ICD-10-CM | POA: Diagnosis not present

## 2024-01-25 DIAGNOSIS — J449 Chronic obstructive pulmonary disease, unspecified: Secondary | ICD-10-CM | POA: Diagnosis not present

## 2024-01-25 DIAGNOSIS — I4821 Permanent atrial fibrillation: Secondary | ICD-10-CM | POA: Diagnosis not present

## 2024-01-25 DIAGNOSIS — E039 Hypothyroidism, unspecified: Secondary | ICD-10-CM | POA: Diagnosis not present

## 2024-02-05 NOTE — Progress Notes (Signed)
 " Electrophysiology Office Note:   Date:  02/07/2024  ID:  ZURIEL YEAMAN, DOB 11/18/1950, MRN [id number]  Primary Cardiologist: Oneil Parchment, MD Electrophysiologist: Fonda Kitty, MD      History of Present Illness:   Deanna Hamilton is a 73 y.o. female with h/o HTN, hypothyroidism, atrial flutter, and atrial fibrillation ablation in 2022 who is being seen today for AF management.   Discussed the use of AI scribe software for clinical note transcription with the patient, who gave verbal consent to proceed.  History of Present Illness Deanna Hamilton is a 73 year old female with atrial fibrillation who presents for evaluation of her condition. She is accompanied by her daughter. She was referred by the AFib clinic for further evaluation of her atrial fibrillation.  She has a history of atrial fibrillation and underwent an ablation procedure in July 2022. Despite the procedure, she has experienced persistent atrial fibrillation since then. She feels well and does not notice any symptoms related to her atrial fibrillation. She has been in atrial fibrillation for at least a year, according to her EKGs.  She is currently taking diltiazem  to manage her heart rate and Eliquis  as a blood thinner to reduce the risk of stroke.  An echocardiogram revealed that the top chambers of her heart are stretched and there is some leakiness in her heart valves. However, she is not experiencing significant symptoms from the valve issue.  She has a history of smoking and does not want to quit.  She reports doing relatively well.  Denies chest pain, worsening shortness of breath, lower extremity edema, PND, orthopnea.  No new or acute complaints today..   Review of systems complete and found to be negative unless listed in HPI.   EP Information / Studies Reviewed:    EKG is not ordered today. EKG from 12/05/23 reviewed which showed AF      Echo 01/16/24:   1. Left ventricular ejection fraction, by  estimation, is 55 to 60%. The  left ventricle has normal function. The left ventricle has no regional  wall motion abnormalities. Left ventricular diastolic function could not  be evaluated.   2. Right ventricular systolic function is normal. The right ventricular  size is mildly enlarged. There is mildly elevated pulmonary artery  systolic pressure. The estimated right ventricular systolic pressure is  41.4 mmHg.   3. Left atrial size was mildly dilated.   4. Right atrial size was mild to moderately dilated.   5. The mitral valve is grossly normal. Moderate to severe mitral valve  regurgitation.   6. Tricuspid valve regurgitation is moderate.   7. The aortic valve is tricuspid. Aortic valve regurgitation is moderate.  No aortic stenosis is present.   8. The inferior vena cava is normal in size with greater than 50%  respiratory variability, suggesting right atrial pressure of 3 mmHg.   9. There is a small patent foramen ovale with bidirectional shunting  across atrial septum.   Risk Assessment/Calculations:    CHA2DS2-VASc Score = 4   This indicates a 4.8% annual risk of stroke. The patient's score is based upon: CHF History: 0 HTN History: 1 Diabetes History: 0 Stroke History: 0 Vascular Disease History: 0 Age Score: 2 Gender Score: 1             Physical Exam:   VS:  BP 138/60 (BP Location: Left Arm, Patient Position: Sitting, Cuff Size: Normal)   Pulse 66   Ht 5' 4.25 (1.632 m)  Wt 105 lb (47.6 kg)   LMP  (LMP Unknown)   SpO2 97%   BMI 17.88 kg/m    Wt Readings from Last 3 Encounters:  02/06/24 105 lb (47.6 kg)  12/05/23 102 lb (46.3 kg)  11/05/23 103 lb 3.2 oz (46.8 kg)     GEN: Well nourished, well developed in no acute distress NECK: No JVD CARDIAC: Normal rate, regular rhythm.  Holosystolic murmur. RESPIRATORY:  Clear to auscultation without rales, wheezing or rhonchi  ABDOMEN: Soft, non-distended EXTREMITIES:  No edema; No deformity   ASSESSMENT  AND PLAN:    # Longstanding persistent atrial fibrillation: S/p PVI ablation 11/30/20. - Discussed risk of progression to permanent atrial fibrillation such as increased risk of stroke, dementia and heart failure.  Discussed rhythm control strategies such as dofetilide or repeat catheter ablation.  Discussed likelihood of success with both and risk associated with both treatments.  Patient would like to think about her options.  If she does not pursue antiarrhythmic drug therapy or repeat catheter ablation, then we will deem her permanent atrial fibrillation moving forward. - Continue diltiazem  120 mg in the morning and 180 mg at bedtime.  She has an additional 30 mg that she can take as needed.  #Hypercoagulable state due to AF: Denies bleeding issues. -Continue Eliquis  5mg  BID.  #Hypertension At goal today.  Recommend checking blood pressures 1-2 times per week at home and recording the values.  Recommend bringing these recordings to the primary care physician.  #Moderate to severe mitral regurgitation: #Moderate TR #Moderate AR - Appears well compensated on exam today.  Will have her follow-up with Dr. Jeffrie to reestablish care given progression of her mitral regurgitation.  Follow up with EP APP in 6 months  Signed, Fonda Kitty, MD  "

## 2024-02-06 ENCOUNTER — Ambulatory Visit: Attending: Cardiology | Admitting: Cardiology

## 2024-02-06 ENCOUNTER — Encounter: Payer: Self-pay | Admitting: Cardiology

## 2024-02-06 VITALS — BP 138/60 | HR 66 | Ht 64.25 in | Wt 105.0 lb

## 2024-02-06 DIAGNOSIS — I10 Essential (primary) hypertension: Secondary | ICD-10-CM

## 2024-02-06 DIAGNOSIS — I4811 Longstanding persistent atrial fibrillation: Secondary | ICD-10-CM | POA: Diagnosis not present

## 2024-02-06 DIAGNOSIS — D6869 Other thrombophilia: Secondary | ICD-10-CM

## 2024-02-06 DIAGNOSIS — I340 Nonrheumatic mitral (valve) insufficiency: Secondary | ICD-10-CM | POA: Diagnosis not present

## 2024-02-06 DIAGNOSIS — I071 Rheumatic tricuspid insufficiency: Secondary | ICD-10-CM

## 2024-02-06 DIAGNOSIS — I351 Nonrheumatic aortic (valve) insufficiency: Secondary | ICD-10-CM | POA: Diagnosis not present

## 2024-02-06 DIAGNOSIS — I4819 Other persistent atrial fibrillation: Secondary | ICD-10-CM

## 2024-02-06 NOTE — Patient Instructions (Signed)

## 2024-02-24 DIAGNOSIS — I4821 Permanent atrial fibrillation: Secondary | ICD-10-CM | POA: Diagnosis not present

## 2024-02-24 DIAGNOSIS — J449 Chronic obstructive pulmonary disease, unspecified: Secondary | ICD-10-CM | POA: Diagnosis not present

## 2024-02-24 DIAGNOSIS — J441 Chronic obstructive pulmonary disease with (acute) exacerbation: Secondary | ICD-10-CM | POA: Diagnosis not present

## 2024-02-24 DIAGNOSIS — E039 Hypothyroidism, unspecified: Secondary | ICD-10-CM | POA: Diagnosis not present

## 2024-02-25 ENCOUNTER — Telehealth: Payer: Self-pay | Admitting: Cardiology

## 2024-02-25 NOTE — Telephone Encounter (Signed)
 Spoke with the patient who states that she would like to proceed with an ablation. She wants to wait to have it done next year. Advised that I will call her once his schedule is released to pick a date. Patient also states that someone tried to call her about referral back to general cardiology with Dr. Jeffrie but she hasn't been able to get back in touch with them to schedule an appointment. Advised that I will have a scheduler reach out to her.

## 2024-02-25 NOTE — Telephone Encounter (Signed)
 Pt is requesting a callback regarding her wanting to discuss the ablation Dr. Kennyth mentioned to her. She has some questions. Please advise

## 2024-03-10 NOTE — Telephone Encounter (Signed)
 Left message for patient to call back to schedule ablation.

## 2024-03-11 NOTE — Telephone Encounter (Signed)
 Patient returned RN's call.

## 2024-03-11 NOTE — Telephone Encounter (Signed)
 Patient has been scheduled for an ablation with Dr. Kennyth on 06/09/24.

## 2024-04-30 ENCOUNTER — Other Ambulatory Visit: Payer: Self-pay | Admitting: Family Medicine

## 2024-04-30 DIAGNOSIS — Z122 Encounter for screening for malignant neoplasm of respiratory organs: Secondary | ICD-10-CM

## 2024-05-03 ENCOUNTER — Telehealth: Payer: Self-pay

## 2024-05-03 ENCOUNTER — Other Ambulatory Visit (HOSPITAL_BASED_OUTPATIENT_CLINIC_OR_DEPARTMENT_OTHER): Payer: Self-pay | Admitting: Family Medicine

## 2024-05-03 DIAGNOSIS — I480 Paroxysmal atrial fibrillation: Secondary | ICD-10-CM

## 2024-05-03 DIAGNOSIS — Z780 Asymptomatic menopausal state: Secondary | ICD-10-CM

## 2024-05-03 NOTE — Telephone Encounter (Signed)
-----   Message from Nurse Carlyle C sent at 03/11/2024  3:55 PM EDT ----- Regarding: 06/09/24 afib ablation I Precert:  MD: Kennyth Type of ablation: A-fib Diagnosis: A-fib CPT code: A-fib (06343) Ablation scheduled (date/time): 06/09/24  Procedure:  Added to calendar? Yes Orders entered? Yes Letter complete? No, >30 days before procedure Scheduled with cath lab? Yes Any medications to hold? No Labs ordered (CBC, BMET, PT/INR if on warfarin): No Mapping system: Doesn't matter CARTO/OPAL rep notified? No Cardiac CT needed? No Dye allergy? No Pre-meds ordered and instructions given? No, not needed Letter method: MyChart H&P: 9/12 Device: No  Follow-up:  Cassie/Angel, please schedule Routine.  Covering RN - please send this message to [employer], EP scheduler, EP Scheduling pool, EP [employer], and CT scheduler (Brittany Lynch/Stephanie Mogg), if indicated.

## 2024-05-24 ENCOUNTER — Telehealth (HOSPITAL_COMMUNITY): Payer: Self-pay

## 2024-05-24 ENCOUNTER — Encounter (HOSPITAL_COMMUNITY): Payer: Self-pay

## 2024-05-24 NOTE — Telephone Encounter (Signed)
 Spoke with patient to discuss upcoming procedure.   CT: not required.  Labs: to be completed.   Any recent signs of acute illness or been started on antibiotics? No Any new medications started?No Any medications to hold?  No Any missed doses of blood thinner?  No Advised patient to continue taking Eliquis  (Apixaban ) twice daily without missing any doses.  Medication instructions:  On the morning of your procedure DO NOT take any medication., including Eliquis  (Apixaban ) or the procedure may be rescheduled. Nothing to eat or drink after midnight prior to your procedure.  Confirmed patient is scheduled for Atrial Fibrillation Ablation on Wednesday, January 14 with Dr. Kennyth. Instructed patient to arrive at the Main Entrance A at [facility]: [address] [place], [place] [address] and check in at Admitting at 6:30 AM.   Plan to go home the same day, you will only stay overnight if medically necessary. You MUST have a responsible adult to drive you home and MUST be with you the first 24 hours after you arrive home or your procedure could be cancelled.  Informed patient a nurse will call a day before the procedure to confirm arrival time and ensure instructions are followed.  Patient verbalized understanding to all instructions provided and agreed to proceed with procedure.   Advised patient to contact RN Navigator at [phone], to inform of any new medications started after call or concerns prior to procedure.

## 2024-05-28 LAB — CBC

## 2024-05-29 ENCOUNTER — Ambulatory Visit: Payer: Self-pay | Admitting: Cardiology

## 2024-05-29 LAB — BASIC METABOLIC PANEL WITH GFR
BUN/Creatinine Ratio: 20 (ref 12–28)
BUN: 13 mg/dL (ref 8–27)
CO2: 26 mmol/L (ref 20–29)
Calcium: 9.5 mg/dL (ref 8.7–10.3)
Chloride: 99 mmol/L (ref 96–106)
Creatinine, Ser: 0.66 mg/dL (ref 0.57–1.00)
Glucose: 86 mg/dL (ref 70–99)
Potassium: 4.3 mmol/L (ref 3.5–5.2)
Sodium: 139 mmol/L (ref 134–144)
eGFR: 91 mL/min/1.73

## 2024-05-29 LAB — CBC
Hematocrit: 43.5 % (ref 34.0–46.6)
Hemoglobin: 14.3 g/dL (ref 11.1–15.9)
MCH: 31.2 pg (ref 26.6–33.0)
MCHC: 32.9 g/dL (ref 31.5–35.7)
MCV: 95 fL (ref 79–97)
Platelets: 214 x10E3/uL (ref 150–450)
RBC: 4.58 x10E6/uL (ref 3.77–5.28)
RDW: 12.6 % (ref 11.7–15.4)
WBC: 5.7 x10E3/uL (ref 3.4–10.8)

## 2024-06-04 ENCOUNTER — Ambulatory Visit
Admission: RE | Admit: 2024-06-04 | Discharge: 2024-06-04 | Disposition: A | Source: Ambulatory Visit | Attending: Family Medicine

## 2024-06-04 DIAGNOSIS — Z122 Encounter for screening for malignant neoplasm of respiratory organs: Secondary | ICD-10-CM

## 2024-06-08 NOTE — Pre-Procedure Instructions (Signed)
 Instructed patient on the following items: Arrival time 0615 Nothing to eat or drink after midnight No meds AM of procedure Responsible person to drive you home and stay with you for 24 hrs  Have you missed any doses of anti-coagulant Eliquis - takes twice a day, hasn't missed any doses in last 4 weeks.  Don't take dose morning procedure.

## 2024-06-09 ENCOUNTER — Other Ambulatory Visit: Payer: Self-pay

## 2024-06-09 ENCOUNTER — Ambulatory Visit (HOSPITAL_COMMUNITY)
Admission: RE | Disposition: A | Discharge status: Home / Self Care | Payer: Self-pay | Source: Home / Self Care | Attending: Cardiology

## 2024-06-09 ENCOUNTER — Ambulatory Visit (HOSPITAL_COMMUNITY)
Admission: RE | Admit: 2024-06-09 | Discharge: 2024-06-10 | Disposition: A | Attending: Cardiology | Admitting: Cardiology

## 2024-06-09 ENCOUNTER — Encounter (HOSPITAL_COMMUNITY): Payer: Self-pay | Admitting: Cardiology

## 2024-06-09 ENCOUNTER — Ambulatory Visit (HOSPITAL_COMMUNITY): Admitting: Anesthesiology

## 2024-06-09 DIAGNOSIS — Z79899 Other long term (current) drug therapy: Secondary | ICD-10-CM | POA: Diagnosis not present

## 2024-06-09 DIAGNOSIS — I083 Combined rheumatic disorders of mitral, aortic and tricuspid valves: Secondary | ICD-10-CM | POA: Diagnosis not present

## 2024-06-09 DIAGNOSIS — Z87891 Personal history of nicotine dependence: Secondary | ICD-10-CM | POA: Insufficient documentation

## 2024-06-09 DIAGNOSIS — F17200 Nicotine dependence, unspecified, uncomplicated: Secondary | ICD-10-CM

## 2024-06-09 DIAGNOSIS — D6869 Other thrombophilia: Secondary | ICD-10-CM | POA: Insufficient documentation

## 2024-06-09 DIAGNOSIS — I2510 Atherosclerotic heart disease of native coronary artery without angina pectoris: Secondary | ICD-10-CM

## 2024-06-09 DIAGNOSIS — E039 Hypothyroidism, unspecified: Secondary | ICD-10-CM | POA: Insufficient documentation

## 2024-06-09 DIAGNOSIS — I10 Essential (primary) hypertension: Secondary | ICD-10-CM | POA: Insufficient documentation

## 2024-06-09 DIAGNOSIS — I4892 Unspecified atrial flutter: Secondary | ICD-10-CM | POA: Insufficient documentation

## 2024-06-09 DIAGNOSIS — J449 Chronic obstructive pulmonary disease, unspecified: Secondary | ICD-10-CM | POA: Diagnosis not present

## 2024-06-09 DIAGNOSIS — I4811 Longstanding persistent atrial fibrillation: Secondary | ICD-10-CM

## 2024-06-09 DIAGNOSIS — I4891 Unspecified atrial fibrillation: Secondary | ICD-10-CM | POA: Diagnosis not present

## 2024-06-09 DIAGNOSIS — Z7901 Long term (current) use of anticoagulants: Secondary | ICD-10-CM | POA: Insufficient documentation

## 2024-06-09 HISTORY — PX: ATRIAL FIBRILLATION ABLATION: EP1191

## 2024-06-09 HISTORY — PX: ELECTROPHYSIOLOGY STUDY: EP1205

## 2024-06-09 HISTORY — PX: A-FLUTTER ABLATION: EP1230

## 2024-06-09 MED ORDER — SODIUM CHLORIDE 0.9 % IV SOLN: 250.0000 mL | INTRAVENOUS | Status: DC | PRN

## 2024-06-09 MED ORDER — APIXABAN 5 MG PO TABS
5.0000 mg | ORAL_TABLET | Freq: Once | ORAL | Status: AC
  Administered 2024-06-09: 5 mg via ORAL
  Filled 2024-06-09: qty 1

## 2024-06-09 MED ORDER — PROPOFOL 10 MG/ML IV BOLUS
INTRAVENOUS | Status: DC | PRN
  Administered 2024-06-09: 50 mg via INTRAVENOUS
  Administered 2024-06-09: 20 mg via INTRAVENOUS

## 2024-06-09 MED ORDER — ACETAMINOPHEN 325 MG PO TABS: 650.0000 mg | ORAL_TABLET | ORAL | Status: DC | PRN

## 2024-06-09 MED ORDER — ONDANSETRON HCL 4 MG/2ML IJ SOLN
INTRAMUSCULAR | Status: DC | PRN
  Administered 2024-06-09: 4 mg via INTRAVENOUS

## 2024-06-09 MED ORDER — LIDOCAINE 2% (20 MG/ML) 5 ML SYRINGE
INTRAMUSCULAR | Status: DC | PRN
  Administered 2024-06-09: 60 mg via INTRAVENOUS

## 2024-06-09 MED ORDER — ROCURONIUM BROMIDE 10 MG/ML (PF) SYRINGE
PREFILLED_SYRINGE | INTRAVENOUS | Status: DC | PRN
  Administered 2024-06-09 (×2): 20 mg via INTRAVENOUS
  Administered 2024-06-09: 5 mg via INTRAVENOUS
  Administered 2024-06-09: 30 mg via INTRAVENOUS
  Administered 2024-06-09 (×2): 10 mg via INTRAVENOUS
  Administered 2024-06-09: 20 mg via INTRAVENOUS

## 2024-06-09 MED ORDER — HEPARIN SODIUM (PORCINE) 1000 UNIT/ML IJ SOLN
INTRAMUSCULAR | Status: DC | PRN
  Administered 2024-06-09 (×2): 1000 [IU] via INTRAVENOUS

## 2024-06-09 MED ORDER — METOPROLOL TARTRATE 25 MG PO TABS
25.0000 mg | ORAL_TABLET | Freq: Three times a day (TID) | ORAL | Status: DC
  Administered 2024-06-09 – 2024-06-10 (×2): 25 mg via ORAL
  Filled 2024-06-09 (×3): qty 1

## 2024-06-09 MED ORDER — HEPARIN SODIUM (PORCINE) 1000 UNIT/ML IJ SOLN
INTRAMUSCULAR | Status: AC
  Filled 2024-06-09: qty 10

## 2024-06-09 MED ORDER — SODIUM CHLORIDE 0.9% FLUSH
3.0000 mL | Freq: Two times a day (BID) | INTRAVENOUS | Status: DC
  Administered 2024-06-09 (×2): 3 mL via INTRAVENOUS

## 2024-06-09 MED ORDER — SUGAMMADEX SODIUM 200 MG/2ML IV SOLN
INTRAVENOUS | Status: DC | PRN
  Administered 2024-06-09: 100 mg via INTRAVENOUS

## 2024-06-09 MED ORDER — ATROPINE SULFATE 1 MG/10ML IJ SOSY
PREFILLED_SYRINGE | INTRAMUSCULAR | Status: DC | PRN
  Administered 2024-06-09: 1 mg via INTRAVENOUS

## 2024-06-09 MED ORDER — FENTANYL CITRATE (PF) 250 MCG/5ML IJ SOLN
INTRAMUSCULAR | Status: DC | PRN
  Administered 2024-06-09: 50 ug via INTRAVENOUS

## 2024-06-09 MED ORDER — ONDANSETRON HCL 4 MG/2ML IJ SOLN: 4.0000 mg | Freq: Four times a day (QID) | INTRAMUSCULAR | Status: DC | PRN

## 2024-06-09 MED ORDER — DEXAMETHASONE SOD PHOSPHATE PF 10 MG/ML IJ SOLN
INTRAMUSCULAR | Status: DC | PRN
  Administered 2024-06-09: 10 mg via INTRAVENOUS

## 2024-06-09 MED ORDER — FENTANYL CITRATE (PF) 100 MCG/2ML IJ SOLN
INTRAMUSCULAR | Status: AC
  Filled 2024-06-09: qty 2

## 2024-06-09 MED ORDER — FUROSEMIDE 10 MG/ML IJ SOLN
20.0000 mg | Freq: Once | INTRAMUSCULAR | Status: AC
  Administered 2024-06-09: 20 mg via INTRAVENOUS
  Filled 2024-06-09: qty 2

## 2024-06-09 MED ORDER — ALBUMIN HUMAN 5 % IV SOLN: INTRAVENOUS | Status: DC | PRN

## 2024-06-09 MED ORDER — SODIUM CHLORIDE 0.9 % IV SOLN: INTRAVENOUS | Status: DC

## 2024-06-09 MED ORDER — HEPARIN (PORCINE) IN NACL 1000-0.9 UT/500ML-% IV SOLN
INTRAVENOUS | Status: DC | PRN
  Administered 2024-06-09 (×3): 500 mL

## 2024-06-09 MED ORDER — APIXABAN 5 MG PO TABS
5.0000 mg | ORAL_TABLET | Freq: Two times a day (BID) | ORAL | Status: DC
  Administered 2024-06-10: 5 mg via ORAL
  Filled 2024-06-09 (×2): qty 1

## 2024-06-09 MED ORDER — LEVOTHYROXINE SODIUM 88 MCG PO TABS
88.0000 ug | ORAL_TABLET | Freq: Every day | ORAL | Status: DC
  Administered 2024-06-10: 88 ug via ORAL
  Filled 2024-06-09: qty 1

## 2024-06-09 MED ORDER — PHENYLEPHRINE HCL-NACL 20-0.9 MG/250ML-% IV SOLN
INTRAVENOUS | Status: DC | PRN
  Administered 2024-06-09: 80 ug/min via INTRAVENOUS

## 2024-06-09 MED ORDER — HEPARIN SODIUM (PORCINE) 1000 UNIT/ML IJ SOLN
INTRAMUSCULAR | Status: DC | PRN
  Administered 2024-06-09: 10000 [IU] via INTRAVENOUS
  Administered 2024-06-09: 1000 [IU] via INTRAVENOUS
  Administered 2024-06-09: 5000 [IU] via INTRAVENOUS
  Administered 2024-06-09: 4000 [IU] via INTRAVENOUS

## 2024-06-09 MED ORDER — PHENYLEPHRINE 80 MCG/ML (10ML) SYRINGE FOR IV PUSH (FOR BLOOD PRESSURE SUPPORT)
PREFILLED_SYRINGE | INTRAVENOUS | Status: DC | PRN
  Administered 2024-06-09: 160 ug via INTRAVENOUS
  Administered 2024-06-09: 80 ug via INTRAVENOUS

## 2024-06-09 MED ORDER — SODIUM CHLORIDE 0.9% FLUSH: 3.0000 mL | INTRAVENOUS | Status: DC | PRN

## 2024-06-09 MED ORDER — PROTAMINE SULFATE 10 MG/ML IV SOLN
INTRAVENOUS | Status: DC | PRN
  Administered 2024-06-09: 40 mg via INTRAVENOUS

## 2024-06-09 MED ORDER — DILTIAZEM HCL ER COATED BEADS 180 MG PO CP24
180.0000 mg | ORAL_CAPSULE | Freq: Every morning | ORAL | Status: DC
  Administered 2024-06-09 – 2024-06-10 (×2): 180 mg via ORAL
  Filled 2024-06-09 (×3): qty 1

## 2024-06-09 NOTE — Anesthesia Preprocedure Evaluation (Signed)
"                                    Anesthesia Evaluation  Patient identified by MRN, date of birth, ID band Patient awake    Reviewed: Allergy & Precautions, H&P , NPO status , Patient's Chart, lab work & pertinent test results  History of Anesthesia Complications (+) PONV and history of anesthetic complications  Airway Mallampati: II  TM Distance: >3 FB Neck ROM: Full    Dental no notable dental hx.    Pulmonary COPD, Current Smoker   Pulmonary exam normal breath sounds clear to auscultation       Cardiovascular hypertension, + CAD  Normal cardiovascular exam+ dysrhythmias Atrial Fibrillation + Valvular Problems/Murmurs AI and MR  Rhythm:Regular Rate:Normal  Severe MR, Mod TR, Mod AI, pHTN    MPRESSIONS     1. Left ventricular ejection fraction, by estimation, is 55 to 60%. The  left ventricle has normal function. The left ventricle has no regional  wall motion abnormalities. Left ventricular diastolic function could not  be evaluated.   2. Right ventricular systolic function is normal. The right ventricular  size is mildly enlarged. There is mildly elevated pulmonary artery  systolic pressure. The estimated right ventricular systolic pressure is  41.4 mmHg.   3. Left atrial size was mildly dilated.   4. Right atrial size was mild to moderately dilated.   5. The mitral valve is grossly normal. Moderate to severe mitral valve  regurgitation.   6. Tricuspid valve regurgitation is moderate.   7. The aortic valve is tricuspid. Aortic valve regurgitation is moderate.  No aortic stenosis is present.   8. The inferior vena cava is normal in size with greater than 50%  respiratory variability, suggesting right atrial pressure of 3 mmHg.   9. There is a small patent foramen ovale with bidirectional shunting  across atrial septum.     Neuro/Psych neg Seizures PSYCHIATRIC DISORDERS Anxiety     negative neurological ROS     GI/Hepatic negative GI ROS, Neg  liver ROS,,,  Endo/Other  Hypothyroidism    Renal/GU negative Renal ROS  negative genitourinary   Musculoskeletal negative musculoskeletal ROS (+)    Abdominal   Peds negative pediatric ROS (+)  Hematology negative hematology ROS (+)   Anesthesia Other Findings   Reproductive/Obstetrics negative OB ROS                              Anesthesia Physical Anesthesia Plan  ASA: 4  Anesthesia Plan: General   Post-op Pain Management: Minimal or no pain anticipated   Induction: Intravenous  PONV Risk Score and Plan: 3 and Ondansetron , Dexamethasone  and Treatment may vary due to age or medical condition  Airway Management Planned: Oral ETT  Additional Equipment:   Intra-op Plan:   Post-operative Plan: Extubation in OR  Informed Consent: I have reviewed the patients History and Physical, chart, labs and discussed the procedure including the risks, benefits and alternatives for the proposed anesthesia with the patient or authorized representative who has indicated his/her understanding and acceptance.     Dental advisory given  Plan Discussed with: CRNA  Anesthesia Plan Comments:         Anesthesia Quick Evaluation  "

## 2024-06-09 NOTE — Progress Notes (Signed)
 Initial EKG showed normal sinus rhythm. At times patient goes into an intermittent sinus tach/atrial fib with RVR 120s. Additional EKG obtained to confirm. Remaining VSS. Dr. Kennyth made aware; no new orders. Will continue to monitor per orders/protocol.Deanna Hamilton E

## 2024-06-09 NOTE — H&P (Signed)
 "  Electrophysiology Note:   Date:  06/09/24  ID:  Deanna Hamilton, DOB [birth year], MRN [id number]   Primary Cardiologist: Oneil Parchment, MD Electrophysiologist: Fonda Kitty, MD       History of Present Illness:   Deanna Hamilton is a 74 y.o. female with h/o HTN, hypothyroidism, atrial flutter, and atrial fibrillation ablation in 2022 who is being seen today for AF management.     Discussed the use of AI scribe software for clinical note transcription with the patient, who gave verbal consent to proceed.   History of Present Illness Deanna Hamilton is a 74 year old female with atrial fibrillation who presents for evaluation of her condition. She is accompanied by her daughter. She was referred by the AFib clinic for further evaluation of her atrial fibrillation.   She has a history of atrial fibrillation and underwent an ablation procedure in July 2022. Despite the procedure, she has experienced persistent atrial fibrillation since then. She feels well and does not notice any symptoms related to her atrial fibrillation. She has been in atrial fibrillation for at least a year, according to her EKGs.   She is currently taking diltiazem  to manage her heart rate and Eliquis  as a blood thinner to reduce the risk of stroke.   An echocardiogram revealed that the top chambers of her heart are stretched and there is some leakiness in her heart valves. However, she is not experiencing significant symptoms from the valve issue.   She has a history of smoking and does not want to quit.   She reports doing relatively well.  Denies chest pain, worsening shortness of breath, lower extremity edema, PND, orthopnea.  No new or acute complaints today..    Interval: Patient presents today for planned ablation. Reports feeling relatively well. No new or acute complaints.   Review of systems complete and found to be negative unless listed in HPI.    EP Information / Studies Reviewed:     EKG is not  ordered today. EKG from 12/05/23 reviewed which showed AF       Echo 01/16/24:   1. Left ventricular ejection fraction, by estimation, is 55 to 60%. The  left ventricle has normal function. The left ventricle has no regional  wall motion abnormalities. Left ventricular diastolic function could not  be evaluated.   2. Right ventricular systolic function is normal. The right ventricular  size is mildly enlarged. There is mildly elevated pulmonary artery  systolic pressure. The estimated right ventricular systolic pressure is  41.4 mmHg.   3. Left atrial size was mildly dilated.   4. Right atrial size was mild to moderately dilated.   5. The mitral valve is grossly normal. Moderate to severe mitral valve  regurgitation.   6. Tricuspid valve regurgitation is moderate.   7. The aortic valve is tricuspid. Aortic valve regurgitation is moderate.  No aortic stenosis is present.   8. The inferior vena cava is normal in size with greater than 50%  respiratory variability, suggesting right atrial pressure of 3 mmHg.   9. There is a small patent foramen ovale with bidirectional shunting  across atrial septum.    Risk Assessment/Calculations:     CHA2DS2-VASc Score = 4   This indicates a 4.8% annual risk of stroke. The patient's score is based upon: CHF History: 0 HTN History: 1 Diabetes History: 0 Stroke History: 0 Vascular Disease History: 0 Age Score: 2 Gender Score: 1  Physical Exam:    Today's Vitals   06/09/24 0640  BP: 119/83  Pulse: 84  Resp: 16  Temp: 98.1 F (36.7 C)  TempSrc: Oral  SpO2: 95%  Weight: 47.6 kg  Height: 5' 4 (1.626 m)  PainSc: 0-No pain   Body mass index is 18.02 kg/m.   GEN: Well nourished, well developed in no acute distress NECK: No JVD CARDIAC: Normal rate, regular rhythm.  Holosystolic murmur. RESPIRATORY:  Clear to auscultation without rales, wheezing or rhonchi  ABDOMEN: Soft, non-distended EXTREMITIES:  No edema; No  deformity    ASSESSMENT AND PLAN:     # Longstanding persistent atrial fibrillation: S/p PVI ablation 11/30/20. - Discussed risk of progression to permanent atrial fibrillation such as increased risk of stroke, dementia and heart failure.  Discussed rhythm control strategies such as dofetilide or repeat catheter ablation.  These risks include but are not limited to stroke, bleeding, vascular damage, tamponade, perforation, damage to the esophagus, lungs, phrenic nerve and other structures, pulmonary vein stenosis, worsening renal function, coronary vasospasm and death.  Discussed potential need for repeat ablation procedures and antiarrhythmic drugs after an initial ablation. The patient understands these risk and wishes to proceed with catheter ablation. - Continue diltiazem  120 mg in the morning and 180 mg at bedtime.  She has an additional 30 mg that she can take as needed.   #Hypercoagulable state due to AF: Denies bleeding issues. -Continue Eliquis  5mg  BID.   #Moderate to severe mitral regurgitation:  #Moderate TR #Moderate AR - Appears well compensated on exam today.  Some component of functional regurgitation due to atrial dilatation. Will have her follow-up with Dr. Jeffrie to reestablish care given progression of her mitral regurgitation.   Follow up with EP team in 3 months.   Signed, Fonda Kitty, MD      "

## 2024-06-09 NOTE — Anesthesia Procedure Notes (Signed)
 Procedure Name: Intubation Date/Time: 06/09/2024 8:48 AM  Performed by: Julien Manus, CRNAPre-anesthesia Checklist: Patient identified, Emergency Drugs available, Suction available and Patient being monitored Patient Re-evaluated:Patient Re-evaluated prior to induction Oxygen Delivery Method: Circle System Utilized Preoxygenation: Pre-oxygenation with 100% oxygen Induction Type: IV induction Ventilation: Mask ventilation without difficulty Laryngoscope Size: Mac and 3 Grade View: Grade I Tube type: Oral Tube size: 7.0 mm Number of attempts: 1 Airway Equipment and Method: Stylet and Oral airway Placement Confirmation: ETT inserted through vocal cords under direct vision, positive ETCO2 and breath sounds checked- equal and bilateral Secured at: 22 cm Tube secured with: Tape Dental Injury: Teeth and Oropharynx as per pre-operative assessment

## 2024-06-09 NOTE — Progress Notes (Signed)
 Patient unable to void laying down. In and out catheter inserted per order; 600 ml clear yellow urine returned. Bilateral groins dressing remained dry/intact. No bleeding or hematoma noted. Will continue to monitor per orders/protocol.Nickey Canedo E

## 2024-06-09 NOTE — Anesthesia Postprocedure Evaluation (Signed)
"   Anesthesia Post Note  Patient: Deanna Hamilton  Procedure(s) Performed: ATRIAL FIBRILLATION ABLATION     Patient location during evaluation: PACU Anesthesia Type: General Level of consciousness: awake and alert Pain management: pain level controlled Vital Signs Assessment: post-procedure vital signs reviewed and stable Respiratory status: spontaneous breathing, nonlabored ventilation, respiratory function stable and patient connected to nasal cannula oxygen Cardiovascular status: blood pressure returned to baseline and stable Postop Assessment: no apparent nausea or vomiting Anesthetic complications: no   There were no known notable events for this encounter.  Last Vitals:  Vitals:   06/09/24 1245 06/09/24 1300  BP: 101/61 106/65  Pulse: 87 89  Resp: 15 (!) 25  Temp:  36.8 C  SpO2: 94% 96%    Last Pain:  Vitals:   06/09/24 1229  TempSrc:   PainSc: 0-No pain                 Thom JONELLE Peoples      "

## 2024-06-09 NOTE — Transfer of Care (Signed)
 Immediate Anesthesia Transfer of Care Note  Patient: Deanna Hamilton  Procedure(s) Performed: ATRIAL FIBRILLATION ABLATION  Patient Location: PACU and Cath Lab  Anesthesia Type:General  Level of Consciousness: awake and alert   Airway & Oxygen Therapy: Patient Spontanous Breathing and Patient connected to nasal cannula oxygen  Post-op Assessment: Report given to RN and Post -op Vital signs reviewed and stable  Post vital signs: Reviewed and stable  Last Vitals:  Vitals Value Taken Time  BP    Temp    Pulse    Resp    SpO2      Last Pain:  Vitals:   06/09/24 0640  TempSrc: Oral  PainSc: 0-No pain         Complications: There were no known notable events for this encounter.

## 2024-06-10 ENCOUNTER — Other Ambulatory Visit (HOSPITAL_COMMUNITY): Payer: Self-pay

## 2024-06-10 DIAGNOSIS — I4811 Longstanding persistent atrial fibrillation: Secondary | ICD-10-CM | POA: Diagnosis not present

## 2024-06-10 DIAGNOSIS — I083 Combined rheumatic disorders of mitral, aortic and tricuspid valves: Secondary | ICD-10-CM | POA: Diagnosis not present

## 2024-06-10 DIAGNOSIS — I4892 Unspecified atrial flutter: Secondary | ICD-10-CM | POA: Diagnosis not present

## 2024-06-10 DIAGNOSIS — D6869 Other thrombophilia: Secondary | ICD-10-CM | POA: Diagnosis not present

## 2024-06-10 LAB — POCT ACTIVATED CLOTTING TIME
Activated Clotting Time: 250 s
Activated Clotting Time: 291 s
Activated Clotting Time: 307 s
Activated Clotting Time: 327 s
Activated Clotting Time: 286 s

## 2024-06-10 LAB — BASIC METABOLIC PANEL WITH GFR
Anion gap: 10 (ref 5–15)
BUN: 17 mg/dL (ref 8–23)
CO2: 26 mmol/L (ref 22–32)
Calcium: 8.8 mg/dL — ABNORMAL LOW (ref 8.9–10.3)
Chloride: 99 mmol/L (ref 98–111)
Creatinine, Ser: 0.68 mg/dL (ref 0.44–1.00)
GFR, Estimated: >60 mL/min
Glucose, Bld: 123 mg/dL — ABNORMAL HIGH (ref 70–99)
Potassium: 4.7 mmol/L (ref 3.5–5.1)
Sodium: 134 mmol/L — ABNORMAL LOW (ref 135–145)

## 2024-06-10 LAB — MAGNESIUM: Magnesium: 1.9 mg/dL (ref 1.7–2.4)

## 2024-06-10 MED ORDER — METOPROLOL SUCCINATE ER 25 MG PO TB24
25.0000 mg | ORAL_TABLET | Freq: Two times a day (BID) | ORAL | Status: DC
  Filled 2024-06-10: qty 1

## 2024-06-10 MED ORDER — METOPROLOL SUCCINATE ER 25 MG PO TB24
25.0000 mg | ORAL_TABLET | Freq: Two times a day (BID) | ORAL | 3 refills | Status: AC
  Filled 2024-06-10: qty 60, 30d supply, fill #0

## 2024-06-10 MED ORDER — COLCHICINE 0.6 MG PO TABS
0.6000 mg | ORAL_TABLET | Freq: Two times a day (BID) | ORAL | 0 refills | Status: AC
  Filled 2024-06-10: qty 10, 5d supply, fill #0

## 2024-06-10 MED FILL — Fentanyl Citrate Preservative Free (PF) Inj 100 MCG/2ML: INTRAMUSCULAR | Qty: 1 | Status: AC

## 2024-06-10 NOTE — TOC CM/SW Note (Signed)
 Transition of Care [facility]) - Inpatient Brief Assessment   Patient Details  Name: Deanna Hamilton MRN: [id number] Date of Birth: 09-Nov-1950  Transition of Care [facility] CM/SW Contact:    Waddell Barnie Rama, RN Phone Number: 06/10/2024, 8:32 AM   Clinical Narrative: From home with daughter, has PCP and insurance on file, states has no HH services in place at this time or DME at home.  States family member  (daughter) will transport them home at [employer] and family is support system, states gets medications from [facility] on [address].  Pta self ambulatory.   There are no ICM needs identified  at this time.  Please place consult for ICM needs.     Transition of Care Asessment: Insurance and Status: Insurance coverage has been reviewed Patient has primary care physician: Yes Home environment has been reviewed: home with daughter Prior level of function:: indep Prior/Current Home Services: No current home services Social Drivers of Health Review: SDOH reviewed no interventions necessary Readmission risk has been reviewed: Yes Transition of care needs: no transition of care needs at this time

## 2024-06-10 NOTE — TOC Transition Note (Signed)
 Transition of Care [facility]) - Discharge Note   Patient Details  Name: Deanna Hamilton MRN: [id number] Date of Birth: 1950-11-14  Transition of Care [facility]) CM/SW Contact:  Waddell Barnie Rama, RN Phone Number: 06/10/2024, 8:34 AM   Clinical Narrative:    For dc today, has no needs.          Patient Goals and CMS Choice            Discharge Placement                       Discharge Plan and Services Additional resources added to the After Visit Summary for                                       Social Drivers of Health (SDOH) Interventions SDOH Screenings   Food Insecurity: No Food Insecurity (06/09/2024)  Housing: Unknown (06/09/2024)  Transportation Needs: No Transportation Needs (06/09/2024)  Utilities: Not At Risk (06/09/2024)  Social Connections: Unknown (06/09/2024)  Tobacco Use: High Risk (02/06/2024)     Readmission Risk Interventions     No data to display

## 2024-06-10 NOTE — Progress Notes (Signed)
 Reviewed AVS, patient expressed understanding of medications, MD follow up reviewed.   Removed IV, Site clean, dry and intact.  See LDA for information on wounds at discharge. CCMD contacted and informed patients is being discharged.  Patient states all belongings brought to the hospital at time of admission are accounted for and packed to take home.  Picked up medications from [facility] pharmacy. [facility] contacted to transport patient to entrance A, where family member was waiting in vehicle to transport home.

## 2024-06-10 NOTE — Discharge Instructions (Signed)

## 2024-06-11 ENCOUNTER — Encounter (HOSPITAL_COMMUNITY): Payer: Self-pay | Admitting: Cardiology

## 2024-07-07 ENCOUNTER — Ambulatory Visit (HOSPITAL_COMMUNITY): Admitting: Physician Assistant

## 2024-08-05 ENCOUNTER — Ambulatory Visit (HOSPITAL_COMMUNITY): Admitting: Physician Assistant

## 2024-09-07 ENCOUNTER — Ambulatory Visit: Admitting: Cardiology
